# Patient Record
Sex: Female | Born: 1940 | Race: White | Hispanic: No | Smoking: Former smoker
Health system: Southern US, Community
[De-identification: ages and names within clinical notes are randomized; demographics above are authoritative.]

## PROBLEM LIST (undated history)

## (undated) DIAGNOSIS — R011 Cardiac murmur, unspecified: Secondary | ICD-10-CM

## (undated) DIAGNOSIS — Z8719 Personal history of other diseases of the digestive system: Secondary | ICD-10-CM

## (undated) DIAGNOSIS — I10 Essential (primary) hypertension: Secondary | ICD-10-CM

## (undated) DIAGNOSIS — B159 Hepatitis A without hepatic coma: Secondary | ICD-10-CM

## (undated) DIAGNOSIS — K8020 Calculus of gallbladder without cholecystitis without obstruction: Secondary | ICD-10-CM

## (undated) DIAGNOSIS — Z9889 Other specified postprocedural states: Secondary | ICD-10-CM

## (undated) DIAGNOSIS — H9319 Tinnitus, unspecified ear: Secondary | ICD-10-CM

## (undated) DIAGNOSIS — C801 Malignant (primary) neoplasm, unspecified: Secondary | ICD-10-CM

## (undated) DIAGNOSIS — E039 Hypothyroidism, unspecified: Secondary | ICD-10-CM

## (undated) HISTORY — DX: Hypothyroidism, unspecified: E03.9

## (undated) HISTORY — DX: Malignant (primary) neoplasm, unspecified: C80.1

## (undated) HISTORY — DX: Hepatitis a without hepatic coma: B15.9

## (undated) HISTORY — DX: Other specified postprocedural states: Z98.890

## (undated) HISTORY — DX: Personal history of other diseases of the digestive system: Z87.19

## (undated) HISTORY — DX: Calculus of gallbladder without cholecystitis without obstruction: K80.20

## (undated) HISTORY — DX: Essential (primary) hypertension: I10

## (undated) HISTORY — DX: Cardiac murmur, unspecified: R01.1

---
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
## 1960-07-02 DIAGNOSIS — B159 Hepatitis A without hepatic coma: Secondary | ICD-10-CM

## 1960-07-02 HISTORY — DX: Hepatitis a without hepatic coma: B15.9

## 1985-07-02 HISTORY — PX: ABDOMINAL HYSTERECTOMY: SHX81

## 1997-07-02 DIAGNOSIS — R011 Cardiac murmur, unspecified: Secondary | ICD-10-CM

## 1997-07-02 HISTORY — DX: Cardiac murmur, unspecified: R01.1

## 2003-07-03 HISTORY — PX: HERNIA REPAIR: SHX51

## 2003-07-03 HISTORY — PX: COLONOSCOPY: SHX174

## 2003-09-25 DIAGNOSIS — Z8719 Personal history of other diseases of the digestive system: Secondary | ICD-10-CM

## 2003-09-25 HISTORY — DX: Personal history of other diseases of the digestive system: Z87.19

## 2004-11-02 ENCOUNTER — Ambulatory Visit: Payer: Self-pay | Admitting: Family Medicine

## 2004-11-07 ENCOUNTER — Ambulatory Visit: Payer: Self-pay | Admitting: General Surgery

## 2005-08-13 ENCOUNTER — Ambulatory Visit: Payer: Self-pay | Admitting: Otolaryngology

## 2005-08-30 HISTORY — PX: THYROIDECTOMY: SHX17

## 2005-09-11 ENCOUNTER — Ambulatory Visit: Payer: Self-pay | Admitting: Otolaryngology

## 2005-09-11 ENCOUNTER — Other Ambulatory Visit: Payer: Self-pay

## 2005-09-19 ENCOUNTER — Ambulatory Visit: Payer: Self-pay | Admitting: Otolaryngology

## 2006-04-11 ENCOUNTER — Ambulatory Visit: Payer: Self-pay | Admitting: Family Medicine

## 2007-08-12 ENCOUNTER — Ambulatory Visit: Payer: Self-pay | Admitting: Family Medicine

## 2008-08-23 ENCOUNTER — Ambulatory Visit: Payer: Self-pay | Admitting: Family Medicine

## 2008-10-25 ENCOUNTER — Ambulatory Visit: Payer: Self-pay | Admitting: Family Medicine

## 2009-03-09 ENCOUNTER — Ambulatory Visit: Payer: Self-pay | Admitting: Family Medicine

## 2009-03-09 ENCOUNTER — Encounter: Payer: Self-pay | Admitting: Cardiology

## 2009-03-28 ENCOUNTER — Ambulatory Visit: Payer: Self-pay | Admitting: Unknown Physician Specialty

## 2009-04-13 ENCOUNTER — Inpatient Hospital Stay: Payer: Self-pay | Admitting: Unknown Physician Specialty

## 2009-04-13 HISTORY — PX: TOTAL KNEE ARTHROPLASTY: SHX125

## 2009-04-19 ENCOUNTER — Ambulatory Visit: Payer: Self-pay | Admitting: Internal Medicine

## 2009-04-20 ENCOUNTER — Encounter: Payer: Self-pay | Admitting: Internal Medicine

## 2009-05-02 ENCOUNTER — Encounter: Payer: Self-pay | Admitting: Internal Medicine

## 2009-06-14 LAB — HM PAP SMEAR

## 2009-08-24 ENCOUNTER — Ambulatory Visit: Payer: Self-pay | Admitting: Family Medicine

## 2010-04-04 ENCOUNTER — Ambulatory Visit: Payer: Self-pay | Admitting: Unknown Physician Specialty

## 2010-04-04 ENCOUNTER — Encounter: Payer: Self-pay | Admitting: Cardiology

## 2010-04-05 ENCOUNTER — Encounter: Payer: Self-pay | Admitting: Cardiology

## 2010-04-17 ENCOUNTER — Ambulatory Visit: Payer: Self-pay | Admitting: Cardiology

## 2010-04-26 ENCOUNTER — Inpatient Hospital Stay: Payer: Self-pay | Admitting: Unknown Physician Specialty

## 2010-04-26 HISTORY — PX: REPLACEMENT TOTAL KNEE: SUR1224

## 2010-04-30 ENCOUNTER — Other Ambulatory Visit: Payer: Self-pay | Admitting: Family Medicine

## 2010-08-01 NOTE — Letter (Signed)
Summary: [facility]   [facility]   Imported By: Roderic Ovens 05/03/2010 12:01:26  _____________________________________________________________________  External Attachment:    Type:   Image     Comment:   External Document

## 2010-08-01 NOTE — Assessment & Plan Note (Signed)
Summary: NP6/AMD   Visit Type:  Initial Consult Primary Provider:  Silva Bandy Smith,M.D.  CC:  Surgical clearance for right knee surgery.   Was at pre-op for knee surgery and EKG abnormal.  Dr. Erin Sons is the Orthopedic surgeron.Marland Kitchen  History of Present Illness: 70 yo with history of HTN and left TKR last year presents for cardiology evaluation prior to right TKR.  Patient was at her preoperative appointment last week and an abnormal ECG result was obtained, so she was referred for cardiology evaluation.  I reviewed the ECG from last week, it was labeled as prior anterior MI.  It showed poor anterior R wave progression (no definite evidence for MI).  ECG in our office today and preoperatively in 9/10 were almost identical and were normal.  I suspect the ECG last week was somewhat abnormal due to precordial lead misplacement.   Patient has never had exertional chest pain or significant exertional dyspnea.  She is limited by right knee pain but is able to get up a flight of steps, vacuum, and do all housework without any shortness of breath.  Her BP is under good control and per her report, lipids have been good when PCP has checked them.  She is a nonsmoker.    ECG (today): NSR, normal (same as 9/10).   Preventive Screening-Counseling & Management  Alcohol-Tobacco     Smoking Status: quit  Caffeine-Diet-Exercise     Does Patient Exercise: no      Drug Use:  no.    Current Medications (verified): 1)  Synthroid 112 Mcg Tabs (Levothyroxine Sodium) .... One Tablet Every A.m. 2)  Fexofenadine Hcl 180 Mg Tabs (Fexofenadine Hcl) .... One Tablet Once Daily 3)  Omeprazole 40 Mg Cpdr (Omeprazole) .... One Tablet Once Daily 4)  Nasonex 50 Mcg/act Susp (Mometasone Furoate) .... Two Sprays in Both Nostrils Daily As Needed For Allergic Rhinitis 5)  Tums Ultra 1000 1000 Mg Chew (Calcium Carbonate Antacid) .... As Needed For Antacid 6)  Calcium-Vitamin D 500-200 Mg-Unit Tabs (Calcium Carbonate-Vitamin  D) .... One Tablet Three Times A Day 7)  Vicodin 5-500 Mg Tabs (Hydrocodone-Acetaminophen) .Marland Kitchen.. 1-2 Tablets Every Six Hours As Needed For Pain 8)  Lisinopril-Hydrochlorothiazide 10-12.5 Mg Tabs (Lisinopril-Hydrochlorothiazide) .... One Tablet Once Daily 9)  Boniva 150 Mg Tabs (Ibandronate Sodium) .... Once A Month 10)  Antivert 25 Mg Tabs (Meclizine Hcl)  Allergies (verified): 1)  ! Streptomycin Sulfate (Streptomycin Sulfate) 2)  ! * Ultram Er 3)  ! * Nucynta 4)  ! Darvocet  Past History:  Family History: Last updated: 04/17/2010 Family History of Cancer: Mother; deceased Family History of CVA or Stroke: Father; deceased No known CAD.   Social History: Last updated: 04/17/2010 Widowed  Retired--Teacher Tobacco Use - Former. --Smoked 1/2 PPD.  Quit in 1987. Alcohol Use - yes--social drinker Regular Exercise - no Drug Use - no  Risk Factors: Exercise: no (04/17/2010)  Risk Factors: Smoking Status: quit (04/17/2010)  Past Medical History: 1. hemorrhoids 2. Hepatitis A 3. Hypothyroidism s/p thyroidectomy 4. hypertension 5. heart murmur  Past Surgical History: s/p total left knee replacement April 13, 2009 @ [facility]. Appendectomy hysterectomy thyroidectomy hernia repair  Family History: Family History of Cancer: Mother; deceased Family History of CVA or Stroke: Father; deceased No known CAD.   Social History: Reviewed history and no changes required. Widowed  Retired--Teacher Tobacco Use - Former. --Smoked 1/2 PPD.  Quit in 1987. Alcohol Use - yes--social drinker Regular Exercise - no Drug Use - no Smoking Status:  quit Does Patient Exercise:  no Drug Use:  no  Review of Systems       All systems reviewed and negative except as per HPI.   Vital Signs:  Patient profile:   70 year old female Height:      64 inches Weight:      188.50 pounds BMI:     32.47 Pulse rate:   97 / minute BP sitting:   128 / 85  (left arm) Cuff size:   regular  Vitals  Entered By: Bishop Dublin, CMA (April 17, 2010 11:02 AM)  Physical Exam  General:  Well developed, well nourished, in no acute distress. Head:  normocephalic and atraumatic Nose:  no deformity, discharge, inflammation, or lesions Mouth:  Teeth, gums and palate normal. Oral mucosa normal. Neck:  Neck supple, no JVD. No masses, thyromegaly or abnormal cervical nodes. Lungs:  Clear bilaterally to auscultation and percussion. Heart:  Non-displaced PMI, chest non-tender; regular rate and rhythm, S1, S2 without murmurs, rubs or gallops. Carotid upstroke normal, no bruit. Pedals normal pulses. No edema, no varicosities. Abdomen:  Bowel sounds positive; abdomen soft and non-tender without masses, organomegaly, or hernias noted. No hepatosplenomegaly. Extremities:  No clubbing or cyanosis. Neurologic:  Alert and oriented x 3. Skin:  Intact without lesions or rashes. Psych:  Normal affect.   Impression & Recommendations:  Problem # 1:  PREOPERATIVE  EVALUATION Patient's ECG is normal and the same as in 9/10.  I suspect the difference with the ECG last week is related to lead placement.  She has reasonable exercise tolerance (> 4 METS), and no history of CAD or CHF.  I think that she can undergo surgery with no further cardiac testing.  Problem # 2:  MURMUR I actually do not hear any significant murmur on exam, though she carries the diagnosis of 'heart murmur."  At this time, no indication for echocardiogram.

## 2010-08-25 ENCOUNTER — Ambulatory Visit: Payer: Self-pay | Admitting: Family Medicine

## 2010-08-29 ENCOUNTER — Ambulatory Visit: Payer: Self-pay | Admitting: Family Medicine

## 2011-01-08 ENCOUNTER — Encounter: Payer: Self-pay | Admitting: Cardiology

## 2011-03-08 ENCOUNTER — Ambulatory Visit: Payer: Self-pay | Admitting: General Surgery

## 2011-09-06 ENCOUNTER — Ambulatory Visit: Payer: Self-pay | Admitting: General Surgery

## 2012-04-17 ENCOUNTER — Ambulatory Visit: Payer: Self-pay | Admitting: Family Medicine

## 2012-04-17 LAB — HM DEXA SCAN

## 2012-07-29 ENCOUNTER — Encounter: Payer: Self-pay | Admitting: General Surgery

## 2012-07-29 ENCOUNTER — Encounter: Payer: Self-pay | Admitting: *Deleted

## 2012-07-31 ENCOUNTER — Encounter: Payer: Self-pay | Admitting: General Surgery

## 2012-08-01 ENCOUNTER — Encounter: Payer: Self-pay | Admitting: *Deleted

## 2012-09-09 ENCOUNTER — Encounter: Payer: Self-pay | Admitting: *Deleted

## 2012-09-10 ENCOUNTER — Ambulatory Visit: Payer: Self-pay | Admitting: General Surgery

## 2012-09-12 ENCOUNTER — Encounter: Payer: Self-pay | Admitting: *Deleted

## 2012-09-18 ENCOUNTER — Ambulatory Visit: Payer: Self-pay | Admitting: General Surgery

## 2012-10-02 ENCOUNTER — Ambulatory Visit (INDEPENDENT_AMBULATORY_CARE_PROVIDER_SITE_OTHER): Payer: Medicare Other | Admitting: General Surgery

## 2012-10-02 ENCOUNTER — Encounter: Payer: Self-pay | Admitting: General Surgery

## 2012-10-02 VITALS — BP 122/84 | HR 68 | Resp 14 | Ht 66.0 in | Wt 181.0 lb

## 2012-10-02 DIAGNOSIS — E039 Hypothyroidism, unspecified: Secondary | ICD-10-CM | POA: Insufficient documentation

## 2012-10-02 DIAGNOSIS — R928 Other abnormal and inconclusive findings on diagnostic imaging of breast: Secondary | ICD-10-CM

## 2012-10-02 NOTE — Patient Instructions (Addendum)
Patient to return as needed.Dr. Elease Hashimoto will order mammograms yearly.

## 2012-10-02 NOTE — Progress Notes (Signed)
Patient ID: Mallory Sanders, female   DOB: March 18, 1941, 72 y.o.   MRN: [id number]  Chief Complaint  Patient presents with  . Follow-up    mammogram     HPI Mallory Sanders is a 72 y.o. female here today for her follow up mammogram done 09/10/12 cat 1.No new breast problems.   HPI/  Past Medical History  Diagnosis Date  . Hemorrhoids   . Hepatitis A 1962  . Hypothyroidism     s/p thyroidectomy  . Hypertension   . Heart murmur 1999  . Gallstone     Past Surgical History  Procedure Laterality Date  . Total knee arthroplasty  04/13/2009    [facility], left knee  . Appendectomy  1950  . Abdominal hysterectomy  1987  . Thyroidectomy  08/2005  . Hernia repair  2005  . Colonoscopy  2005    Dr. Lemar Livings  . Replacement total knee  04/26/10    right    Family History  Problem Relation Age of Onset  . Cancer Mother   . Stroke Father   . Coronary artery disease Neg Hx     Social History History  Substance Use Topics  . Smoking status: Former Smoker -- 0.50 packs/day    Quit date: 07/02/1985  . Smokeless tobacco: Not on file  . Alcohol Use: Yes     Comment: Social drinker    Allergies  Allergen Reactions  . Propoxyphene-Acetaminophen   . Streptomycin   . Tramadol Hcl   . Ultra Antioxidant Formula (Anti-Oxidant) Nausea Only    Current Outpatient Prescriptions  Medication Sig Dispense Refill  . Calcium Carbonate-Vitamin D (CALCIUM-VITAMIN D) 500-200 MG-UNIT per tablet Take 1 tablet by mouth 3 (three) times daily.        . calcium elemental as carbonate (TUMS ULTRA 1000) 400 MG tablet Chew 1,000 mg by mouth as needed.        . fexofenadine (ALLEGRA) 180 MG tablet Take 180 mg by mouth daily.        . lansoprazole (PREVACID) 15 MG capsule Take 15 mg by mouth daily.      Marland Kitchen levothyroxine (SYNTHROID, LEVOTHROID) 112 MCG tablet Take 112 mcg by mouth every morning.        Marland Kitchen lisinopril-hydrochlorothiazide (PRINZIDE,ZESTORETIC) 10-12.5 MG per tablet Take 1 tablet by mouth daily.        .  meclizine (ANTIVERT) 25 MG tablet Take 25 mg by mouth.        . mometasone (NASONEX) 50 MCG/ACT nasal spray Place 2 sprays into the nose as needed.        Marland Kitchen omeprazole (PRILOSEC) 40 MG capsule Take 40 mg by mouth daily.        Marland Kitchen HYDROcodone-acetaminophen (VICODIN) 5-500 MG per tablet Take 1 tablet by mouth every 6 (six) hours as needed.        . ibandronate (BONIVA) 150 MG tablet Take 150 mg by mouth every 30 (thirty) days. Take in the morning with a full glass of water, on an empty stomach, and do not take anything else by mouth or lie down for the next 30 min.        No current facility-administered medications for this visit.    Review of Systems Review of Systems  Constitutional: Negative.   Respiratory: Negative.   Cardiovascular: Negative.     Blood pressure 122/84, pulse 68, resp. rate 14, height 5\' 6"  (1.676 m), weight 181 lb (82.101 kg).  Physical Exam Physical Exam  Constitutional: She appears well-developed and  well-nourished.  Neck: Normal range of motion. Neck supple.  Cardiovascular: Normal rate, regular rhythm and normal heart sounds.   Pulmonary/Chest: Effort normal and breath sounds normal. Right breast exhibits no inverted nipple, no mass, no nipple discharge, no skin change and no tenderness. Left breast exhibits no inverted nipple, no mass, no nipple discharge, no skin change and no tenderness.    Data Reviewed Bilateral mammograms didn't September 10, 2012 were reviewed. No areas of architectural distortion or focal asymmetry as previously identified on the right CC view.  Assessment    Benign breast exam and mammogram. The previously identified small hypoechoic area on ultrasound secondary to minimal ductal dilatation appears no longer evident on mammographic imaging. A repeat ultrasound was not felt necessary.    Plan    Annual bilateral screening mammograms with her primary care provider.        Mallory Sanders 10/02/2012, 9:31 PM

## 2012-11-11 ENCOUNTER — Encounter: Payer: Self-pay | Admitting: General Surgery

## 2012-12-10 DIAGNOSIS — K429 Umbilical hernia without obstruction or gangrene: Secondary | ICD-10-CM | POA: Insufficient documentation

## 2013-01-14 LAB — LIPID PANEL
CHOLESTEROL: 232 mg/dL — AB (ref 0–200)
HDL: 75 mg/dL — AB (ref 35–70)
LDL Cholesterol: 141 mg/dL
Triglycerides: 82 mg/dL (ref 40–160)

## 2013-01-14 LAB — HEPATIC FUNCTION PANEL
ALT: 8 U/L (ref 7–35)
AST: 18 U/L (ref 13–35)

## 2013-01-14 LAB — CBC AND DIFFERENTIAL
HCT: 42 % (ref 36–46)
HEMOGLOBIN: 14 g/dL (ref 12.0–16.0)
Platelets: 268 10*3/uL (ref 150–399)
WBC: 5.7 10*3/mL

## 2013-01-14 LAB — BASIC METABOLIC PANEL
BUN: 12 mg/dL (ref 4–21)
Creatinine: 1 mg/dL (ref 0.5–1.1)
Glucose: 98 mg/dL
Potassium: 4.8 mmol/L (ref 3.4–5.3)
SODIUM: 138 mmol/L (ref 137–147)

## 2013-02-09 ENCOUNTER — Other Ambulatory Visit: Payer: Self-pay | Admitting: General Surgery

## 2013-02-09 ENCOUNTER — Encounter: Payer: Self-pay | Admitting: General Surgery

## 2013-02-09 ENCOUNTER — Ambulatory Visit (INDEPENDENT_AMBULATORY_CARE_PROVIDER_SITE_OTHER): Payer: Medicare Other | Admitting: General Surgery

## 2013-02-09 VITALS — BP 130/70 | HR 74 | Resp 12 | Ht 66.0 in | Wt 186.0 lb

## 2013-02-09 DIAGNOSIS — K429 Umbilical hernia without obstruction or gangrene: Secondary | ICD-10-CM

## 2013-02-09 NOTE — Progress Notes (Addendum)
Patient ID: Mallory Sanders, female   DOB: 11-29-1940, 72 y.o.   MRN: [id number]  Chief Complaint  Patient presents with  . Other    hernia    HPI [name] is a 72 y.o. female here today for an evaluation of an umbilical hernia . Patient noticed this about two months ago. She state it does not hurt and does not  think it is getting bigger.  The patient underwent repair of an incarcerated ventral hernia in 2005 with preperitoneal placement of a 7 cm Kugel patch. HPI  Past Medical History  Diagnosis Date  . Hemorrhoids   . Hepatitis A 1962  . Hypothyroidism     s/p thyroidectomy  . Hypertension   . Heart murmur 1999  . Gallstone     Past Surgical History  Procedure Laterality Date  . Total knee arthroplasty  04/13/2009    [facility], left knee  . Appendectomy  1950  . Abdominal hysterectomy  1987  . Thyroidectomy  08/2005  . Hernia repair  2005  . Colonoscopy  2005    Dr. Lemar Livings  . Replacement total knee  04/26/10    right    Family History  Problem Relation Age of Onset  . Cancer Mother   . Stroke Father   . Coronary artery disease Neg Hx     Social History History  Substance Use Topics  . Smoking status: Former Smoker -- 0.50 packs/day    Quit date: 07/02/1985  . Smokeless tobacco: Never Used  . Alcohol Use: Yes     Comment: Social drinker    Allergies  Allergen Reactions  . Propoxyphene-Acetaminophen   . Streptomycin   . Tramadol Hcl   . Ultra Antioxidant Formula (Anti-Oxidant) Nausea Only    Current Outpatient Prescriptions  Medication Sig Dispense Refill  . Calcium Carbonate-Vitamin D (CALCIUM-VITAMIN D) 500-200 MG-UNIT per tablet Take 1 tablet by mouth 3 (three) times daily.        . calcium elemental as carbonate (TUMS ULTRA 1000) 400 MG tablet Chew 1,000 mg by mouth as needed.        . fexofenadine (ALLEGRA) 180 MG tablet Take 180 mg by mouth daily.        Marland Kitchen HYDROcodone-acetaminophen (VICODIN) 5-500 MG per tablet Take 1 tablet by mouth every 6  (six) hours as needed.        . ibandronate (BONIVA) 150 MG tablet Take 150 mg by mouth every 30 (thirty) days. Take in the morning with a full glass of water, on an empty stomach, and do not take anything else by mouth or lie down for the next 30 min.       . lansoprazole (PREVACID) 15 MG capsule Take 15 mg by mouth daily.      Marland Kitchen levothyroxine (SYNTHROID, LEVOTHROID) 112 MCG tablet Take 112 mcg by mouth every morning.        Marland Kitchen lisinopril-hydrochlorothiazide (PRINZIDE,ZESTORETIC) 10-12.5 MG per tablet Take 1 tablet by mouth daily.        . meclizine (ANTIVERT) 25 MG tablet Take 25 mg by mouth.        . mometasone (NASONEX) 50 MCG/ACT nasal spray Place 2 sprays into the nose as needed.        Marland Kitchen omeprazole (PRILOSEC) 40 MG capsule Take 40 mg by mouth daily.         No current facility-administered medications for this visit.    Review of Systems Review of Systems  Constitutional: Negative.   Respiratory: Negative.  Cardiovascular: Negative.     Blood pressure 130/70, pulse 74, resp. rate 12, height 5\' 6"  (1.676 m), weight 186 lb (84.369 kg). The patient's weight is up 5 pounds from her 2005 exam.  Physical Exam Physical Exam  Constitutional: She is oriented to person, place, and time. She appears well-developed and well-nourished.  Neck: Neck supple.  Cardiovascular: Normal rate, regular rhythm and normal heart sounds.   Pulmonary/Chest: Breath sounds normal.  Abdominal: Soft. Bowel sounds are normal. There is no tenderness. A hernia (reducible umbilical hernia) is present.  Well healed midline scar below the umbilicus. With the legs elevated off the examining table, no evidence of recurrent lower midline hernia.  A lobule of pre-peritoneal fat was reduced through a 1 cm fascial defect at the umbilical level.  Neurological: She is alert and oriented to person, place, and time.  Skin: Skin is warm and dry.    Data Reviewed Laboratory studies dated 01/14/2013 showed a hemoglobin of  14.0, white blood cell count 5700, platelet count of 268,000, creatinine 1.0 with an estimated GFR of 57, normal electrolytes, negative urinalysis. Assessment    Umbilical hernia.    Plan    Considering her previous presentation with incarcerated ventral hernia, I have encouraged her to consider elective repair. She would like to defer this until after Labor Day, which I think is reasonable as she is presently asymptomatic.       [name] 02/09/2013, 1:29 PM

## 2013-02-09 NOTE — Patient Instructions (Addendum)
Hernia, Surgical Repair A hernia occurs when an internal organ pushes out through a weak spot in the belly (abdominal) wall muscles. Hernias commonly occur in the groin and around the navel. Hernias often can be pushed back into place (reduced). Most hernias tend to get worse over time. Problems occur when abdominal contents get stuck in the opening (incarcerated hernia). The blood supply gets cut off (strangulated hernia). This is an emergency and needs surgery. Otherwise, hernia repair can be an elective procedure. This means you can schedule this at your convenience when an emergency is not present. Because complications can occur, if you decide to repair the hernia, it is best to do it soon. When it becomes an emergency procedure, there is increased risk of complications after surgery. CAUSES   Heavy lifting.  Obesity.  Prolonged coughing.  Straining to move your bowels.  Hernias can also occur through a cut (incision) by a surgeonafter an abdominal operation. HOME CARE INSTRUCTIONS Before the repair:  Bed rest is not required. You may continue your normal activities, but avoid heavy lifting (more than 10 pounds) or straining. Cough gently. If you are a smoker, it is best to stop. Even the best hernia repair can break down with the continual strain of coughing.  Do not wear anything tight over your hernia. Do not try to keep it in with an outside bandage or truss. These can damage abdominal contents if they are trapped in the hernia sac.  Eat a normal diet. Avoid constipation. Straining over long periods of time to have a bowel movement will increase hernia size. It also can breakdown repairs. If you cannot do this with diet alone, laxatives or stool softeners may be used. PRIOR TO SURGERY, SEEK IMMEDIATE MEDICAL CARE IF: You have problems (symptoms) of a trapped (incarcerated) hernia. Symptoms include:  An oral temperature above 102 F (38.9 C) develops, or as your caregiver  suggests.  Increasing abdominal pain.  Feeling sick to your stomach(nausea) and vomiting.  You stop passing gas or stool.  The hernia is stuck outside the abdomen, looks discolored, feels hard, or is tender.  You have any changes in your bowel habits or in the hernia that is unusual for you. LET YOUR CAREGIVERS KNOW ABOUT THE FOLLOWING:  Allergies.  Medications taken including herbs, eye drops, over the counter medications, and creams.  Use of steroids (by mouth or creams).  Family or personal history of problems with anesthetics or Novocaine.  Possibility of pregnancy, if this applies.  Personal history of blood clots (thrombophlebitis).  Family or personal history of bleeding or blood problems.  Previous surgery.  Other health problems. BEFORE THE PROCEDURE You should be present 1 hour prior to your procedure, or as directed by your caregiver.  AFTER THE PROCEDURE After surgery, you will be taken to the recovery area. A nurse will watch and check your progress there. Once you are awake, stable, and taking fluids well, you will be allowed to go home as long as there are no problems. Once home, an ice pack (wrapped in a light towel) applied to your operative site may help with discomfort. It may also keep the swelling down. Do not lift anything heavier than 10 pounds (4.55 kilograms). Take showers not baths. Do not drive while taking narcotics. Follow instructions as suggested by your caregiver.  SEEK IMMEDIATE MEDICAL CARE IF: After surgery:  There is redness, swelling, or increasing pain in the wound.  There is pus coming from the wound.  There is  drainage from a wound lasting longer than 1 day.  An unexplained oral temperature above 102 F (38.9 C) develops.  You notice a foul smell coming from the wound or dressing.  There is a breaking open of a wound (edged not staying together) after the sutures have been removed.  You notice increasing pain in the shoulders  (shoulder strap areas).  You develop dizzy episodes or fainting while standing.  You develop persistent nausea or vomiting.  You develop a rash.  You have difficulty breathing.  You develop any reaction or side effects to medications given. MAKE SURE YOU:   Understand these instructions.  Will watch your condition.  Will get help right away if you are not doing well or get worse. Document Released: 12/12/2000 Document Revised: 09/10/2011 Document Reviewed: 11/04/2007 [facility] Patient Information 2014 [place], [place].  Patient's surgery has been scheduled for 03-16-13 at [facility]. It is okay for patient to continue 81 mg aspirin.

## 2013-03-09 ENCOUNTER — Ambulatory Visit: Payer: Self-pay | Admitting: General Surgery

## 2013-03-16 ENCOUNTER — Ambulatory Visit: Payer: Self-pay | Admitting: General Surgery

## 2013-03-16 DIAGNOSIS — K429 Umbilical hernia without obstruction or gangrene: Secondary | ICD-10-CM

## 2013-03-16 HISTORY — PX: HERNIA REPAIR: SHX51

## 2013-03-18 ENCOUNTER — Encounter: Payer: Self-pay | Admitting: General Surgery

## 2013-03-25 ENCOUNTER — Ambulatory Visit (INDEPENDENT_AMBULATORY_CARE_PROVIDER_SITE_OTHER): Payer: Medicare Other | Admitting: General Surgery

## 2013-03-25 ENCOUNTER — Encounter: Payer: Self-pay | Admitting: General Surgery

## 2013-03-25 VITALS — BP 140/80 | HR 74 | Resp 12 | Ht 66.0 in | Wt 189.0 lb

## 2013-03-25 DIAGNOSIS — K429 Umbilical hernia without obstruction or gangrene: Secondary | ICD-10-CM

## 2013-03-25 NOTE — Patient Instructions (Signed)
Patient to return as needed. 

## 2013-03-25 NOTE — Progress Notes (Signed)
Patient ID: Mallory Sanders, female   DOB: [birth year], 72 y.o.   MRN: [id number]  Chief Complaint  Patient presents with  . Routine Post Op    umbilical hernia    HPI Mallory Sanders is a 72 y.o. female here today for her post op umbilical hernia repair done on 03/16/13. Patient state she is doing well. She has not required any narcotic analgesics and surgery.  HPI  Past Medical History  Diagnosis Date  . Hemorrhoids   . Hepatitis A 1962  . Hypothyroidism     s/p thyroidectomy  . Hypertension   . Heart murmur 1999  . Gallstone     Past Surgical History  Procedure Laterality Date  . Total knee arthroplasty  04/13/2009    [facility], left knee  . Appendectomy  1950  . Abdominal hysterectomy  1987  . Thyroidectomy  08/2005  . Colonoscopy  2005    Dr. Lemar Livings  . 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 Hernia repair  2005  . Hernia repair  03/16/13    umbilical hernia repair    Family History  Problem Relation Age of Onset  . Cancer Mother   . Stroke Father   . Coronary artery disease Neg Hx     Social History History  Substance Use Topics  . Smoking status: Former Smoker -- 0.50 packs/day    Quit date: 07/02/1985  . Smokeless tobacco: Never Used  . Alcohol Use: Yes     Comment: Social drinker    Allergies  Allergen Reactions  . Propoxyphene-Acetaminophen   . Streptomycin   . Tramadol Hcl   . Ultra Antioxidant Formula [Anti-Oxidant] Nausea Only    Current Outpatient Prescriptions  Medication Sig Dispense Refill  . Calcium Carbonate-Vitamin D (CALCIUM-VITAMIN D) 500-200 MG-UNIT per tablet Take 1 tablet by mouth 3 (three) times daily.        . calcium elemental as carbonate (TUMS ULTRA 1000) 400 MG tablet Chew 1,000 mg by mouth as needed.        . fexofenadine (ALLEGRA) 180 MG tablet Take 180 mg by mouth daily.        Marland Kitchen HYDROcodone-acetaminophen (VICODIN) 5-500 MG per tablet Take 1 tablet by mouth every 6 (six) hours as needed.        . ibandronate (BONIVA) 150 MG  tablet Take 150 mg by mouth every 30 (thirty) days. Take in the morning with a full glass of water, on an empty stomach, and do not take anything else by mouth or lie down for the next 30 min.       . lansoprazole (PREVACID) 15 MG capsule Take 15 mg by mouth daily.      Marland Kitchen levothyroxine (SYNTHROID, LEVOTHROID) 112 MCG tablet Take 112 mcg by mouth every morning.        Marland Kitchen lisinopril-hydrochlorothiazide (PRINZIDE,ZESTORETIC) 10-12.5 MG per tablet Take 1 tablet by mouth daily.        . meclizine (ANTIVERT) 25 MG tablet Take 25 mg by mouth.        . mometasone (NASONEX) 50 MCG/ACT nasal spray Place 2 sprays into the nose as needed.        Marland Kitchen omeprazole (PRILOSEC) 40 MG capsule Take 40 mg by mouth daily.         No current facility-administered medications for this visit.    Review of Systems Review of Systems  Constitutional: Negative.   Respiratory: Negative.   Cardiovascular: Negative.     Blood pressure 140/80, pulse  74, resp. rate 12, height 5\' 6"  (1.676 m), weight 189 lb (85.73 kg).  Physical Exam Physical Exam  Constitutional: She is oriented to person, place, and time. She appears well-developed and well-nourished.  Abdominal: Hernia: umbilical hernia site well-healed.  Mild bruising at the surgery site. 5 by 9 bruising on left forearm.  Neurological: She is alert and oriented to person, place, and time.  Skin: Skin is warm and dry. No erythema.    Data Reviewed None  Assessment    Doing well status post umbilical hernia repair.     Plan    Care with her nurse lifting was reviewed. Followup will be on an as-needed basis.        [name] 03/25/2013, 9:20 PM

## 2013-04-02 ENCOUNTER — Encounter: Payer: Self-pay | Admitting: General Surgery

## 2013-05-07 ENCOUNTER — Other Ambulatory Visit: Payer: Self-pay

## 2013-09-17 ENCOUNTER — Ambulatory Visit: Payer: Self-pay | Admitting: Family Medicine

## 2014-05-03 ENCOUNTER — Encounter: Payer: Self-pay | Admitting: General Surgery

## 2014-10-22 NOTE — Op Note (Signed)
PATIENT NAME:  Mallory Sanders, Mallory Sanders MR#:  [id number] DATE OF BIRTH:  Jul 02, 1941  DATE OF PROCEDURE:  03/16/2013  PREOPERATIVE DIAGNOSIS: Symptomatic umbilical hernia.   POSTOPERATIVE DIAGNOSIS: Symptomatic umbilical hernia.   OPERATIVE PROCEDURE: Primary umbilical hernia repair.   SURGEON: Hervey Ard, MD.  ANESTHESIA: General by LMA under Dr. Benjamine Mola. Marcaine 0.5% plain, 30 mL local infiltration.   ESTIMATED BLOOD LOSS: Minimal.  CLINICAL NOTE: This 74 year old woman has developed a symptomatic umbilical hernia. She is admitted for elective repair. She received Kefzol prior to the procedure in the event prosthetic mesh was utilized.   The patient underwent general anesthesia without difficulty. ChloraPrep was applied to the skin. An infraumbilical incision was made after instillation of local anesthetic. Skin was incised sharply and the remaining dissection was completed with electrocautery. The hernia sac was dissected from the overlying skin, excised at the fascial level, and discarded. The defect was approximately 1.7 cm in diameter. Palpation through the defect showed no other additional defects.   The defect was closed with interrupted 0 Surgilon sutures placed under direct vision. The skin of the umbilicus was tacked to the fascia with 3-0 Vicryl suture. The deep layer was closed with a running 3-0 Vicryl and the skin closed with a running 4-0 Vicryl subcuticular suture. Benzoin, Steri-Strips, Telfa, and Tegaderm dressing were then applied.   The patient tolerated the procedure well and was taken to the recovery room in stable condition.    ____________________________ Robert Bellow, MD jwb:np D: 03/16/2013 21:32:02 ET T: 03/16/2013 22:58:10 ET JOB#: [id number]  cc: Robert Bellow, MD, <Dictator> Renette Butters. Tamala Julian, MD [place] Amedeo Kinsman MD ELECTRONICALLY SIGNED 03/18/2013 9:16

## 2014-10-27 ENCOUNTER — Ambulatory Visit: Admit: 2014-10-27 | Disposition: A | Discharge status: Home / Self Care | Payer: Self-pay | Admitting: Family Medicine

## 2014-10-27 LAB — HM MAMMOGRAPHY

## 2014-11-08 DIAGNOSIS — M8580 Other specified disorders of bone density and structure, unspecified site: Secondary | ICD-10-CM | POA: Insufficient documentation

## 2014-11-08 DIAGNOSIS — K219 Gastro-esophageal reflux disease without esophagitis: Secondary | ICD-10-CM | POA: Insufficient documentation

## 2014-11-08 DIAGNOSIS — E041 Nontoxic single thyroid nodule: Secondary | ICD-10-CM | POA: Insufficient documentation

## 2014-11-08 DIAGNOSIS — E669 Obesity, unspecified: Secondary | ICD-10-CM | POA: Insufficient documentation

## 2014-11-08 DIAGNOSIS — R011 Cardiac murmur, unspecified: Secondary | ICD-10-CM | POA: Insufficient documentation

## 2014-11-08 DIAGNOSIS — M1990 Unspecified osteoarthritis, unspecified site: Secondary | ICD-10-CM | POA: Insufficient documentation

## 2014-11-08 DIAGNOSIS — I10 Essential (primary) hypertension: Secondary | ICD-10-CM | POA: Insufficient documentation

## 2014-11-08 DIAGNOSIS — J309 Allergic rhinitis, unspecified: Secondary | ICD-10-CM | POA: Insufficient documentation

## 2014-11-08 DIAGNOSIS — R928 Other abnormal and inconclusive findings on diagnostic imaging of breast: Secondary | ICD-10-CM | POA: Insufficient documentation

## 2014-11-08 DIAGNOSIS — Z803 Family history of malignant neoplasm of breast: Secondary | ICD-10-CM | POA: Insufficient documentation

## 2014-12-31 HISTORY — PX: SKIN CANCER EXCISION: SHX779

## 2015-01-04 ENCOUNTER — Encounter: Payer: Self-pay | Admitting: *Deleted

## 2015-01-10 ENCOUNTER — Ambulatory Visit (INDEPENDENT_AMBULATORY_CARE_PROVIDER_SITE_OTHER): Payer: Medicare Other | Admitting: General Surgery

## 2015-01-10 ENCOUNTER — Encounter: Payer: Self-pay | Admitting: General Surgery

## 2015-01-10 VITALS — BP 126/72 | HR 80 | Resp 12 | Ht 64.0 in | Wt 187.0 lb

## 2015-01-10 DIAGNOSIS — Z1211 Encounter for screening for malignant neoplasm of colon: Secondary | ICD-10-CM

## 2015-01-10 MED ORDER — AMOXICILLIN 500 MG PO TABS: 500.0000 mg | ORAL_TABLET | Freq: Once | ORAL | Status: DC

## 2015-01-10 MED ORDER — POLYETHYLENE GLYCOL 3350 17 GM/SCOOP PO POWD: ORAL | Status: DC

## 2015-01-10 NOTE — Progress Notes (Signed)
Patient ID: Mallory Sanders, female   DOB: 1940/12/29, 74 y.o.   MRN: [id number]  Chief Complaint  Patient presents with  . Other    colonoscopy    HPI Mallory Sanders is a 74 y.o. female here today for a evaluation of a colonoscopy. Last colonoscopy was done on 11/03/2004. Patient states she is not having any GI problems at this time.  HPI  Past Medical History  Diagnosis Date  . Hemorrhoids   . Hepatitis A 1962  . Hypothyroidism     s/p thyroidectomy  . Hypertension   . Heart murmur 1999  . Gallstone   . 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 Total knee arthroplasty  04/13/2009    [facility], left knee  . Appendectomy  1950  . Abdominal hysterectomy  1987  . Thyroidectomy  08/2005  . Colonoscopy  2005    Dr. Bary Castilla  . 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 Hernia repair  2005  . Hernia repair  9/56/38    umbilical hernia repair    Family History  Problem Relation Age of Onset  . Cancer Mother   . Breast cancer Mother 70  . Stroke Father   . Coronary artery disease Neg Hx   . Breast cancer Maternal Aunt   . Colon cancer Maternal Uncle   . Breast cancer Other     Social History History  Substance Use Topics  . Smoking status: Former Smoker -- 0.50 packs/day    Quit date: 07/02/1985  . Smokeless tobacco: Never Used  . Alcohol Use: Yes     Comment: Social drinker    Allergies  Allergen Reactions  . Propoxyphene N-Acetaminophen   . Streptomycin   . Tramadol Hcl   . Ultra Antioxidant Formula [Anti-Oxidant] Nausea Only    Current Outpatient Prescriptions  Medication Sig Dispense Refill  . aspirin 81 MG tablet Take by mouth.    . Calcium Carbonate-Vitamin D (CALCIUM-VITAMIN D) 500-200 MG-UNIT per tablet Take 1 tablet by mouth 3 (three) times daily.      . calcium elemental as carbonate (TUMS ULTRA 1000) 400 MG tablet Chew 1,000 mg by mouth as needed.      . fexofenadine (ALLEGRA) 180 MG tablet  Take 180 mg by mouth daily.      Marland Kitchen levothyroxine (SYNTHROID, LEVOTHROID) 112 MCG tablet Take 112 mcg by mouth every morning.      Marland Kitchen lisinopril-hydrochlorothiazide (PRINZIDE,ZESTORETIC) 10-12.5 MG per tablet Take 1 tablet by mouth daily.      . mometasone (NASONEX) 50 MCG/ACT nasal spray Place 2 sprays into the nose as needed.      Marland Kitchen amoxicillin (AMOXIL) 500 MG tablet Take 1 tablet (500 mg total) by mouth once. 4 tablet 0  . polyethylene glycol powder (GLYCOLAX/MIRALAX) powder 255 grams one bottle for colonoscopy prep 255 g 0   No current facility-administered medications for this visit.    Review of Systems Review of Systems  Blood pressure 126/72, pulse 80, resp. rate 12, height 5\' 4"  (1.626 m), weight 187 lb (84.823 kg).  Physical Exam Physical Exam  Constitutional: She is oriented to person, place, and time. She appears well-nourished.  HENT:  Head:    Mouth/Throat: Oropharynx is clear and moist.  Eyes: Conjunctivae are normal. No scleral icterus.  Neck: Neck supple.  Cardiovascular: Normal rate, regular rhythm and normal heart sounds.   Pulses:  Dorsalis pedis pulses are 2+ on the right side, and 2+ on the left side.       Posterior tibial pulses are 2+ on the right side, and 2+ on the left side.  No edema  Pulmonary/Chest: Effort normal and breath sounds normal.  Abdominal: Soft. Normal appearance and bowel sounds are normal. There is no tenderness. No hernia.  Lymphadenopathy:    She has no cervical adenopathy.  Neurological: She is alert and oriented to person, place, and time.  Skin: Skin is warm and dry.    Data Reviewed Colonoscopy of 11/07/2004 was reviewed. Normal study.  Assessment    Candidate for screening colonoscopy. Antibiotic prophylaxis based on previous bilateral total knee replacement and orthopedic request.    Plan    The patient tolerates amoxicillin well. She'll make use of 500 mg, 4 tablets by mouth 1 hour prior to the procedure.     Colonoscopy with possible biopsy/polypectomy prn: Information regarding the procedure, including its potential risks and complications (including but not limited to perforation of the bowel, which may require emergency surgery to repair, and bleeding) was verbally given to the patient. Educational information regarding lower instestinal endoscopy was given to the patient. Written instructions for how to complete the bowel prep using Miralax were provided. The importance of drinking ample fluids to avoid dehydration as a result of the prep emphasized.  Patient has been scheduled for a colonoscopy on 02-01-15 at [facility]. It is okay for patient to continue 81 mg aspirin once daily.   Robert Bellow 01/10/2015, 12:40 PM

## 2015-01-10 NOTE — Patient Instructions (Addendum)
Colonoscopy A colonoscopy is an exam to look at the entire large intestine (colon). This exam can help find problems such as tumors, polyps, inflammation, and areas of bleeding. The exam takes about 1 hour.  LET [facility] CARE PROVIDER KNOW ABOUT:   Any allergies you have.  All medicines you are taking, including vitamins, herbs, eye drops, creams, and over-the-counter medicines.  Previous problems you or members of your family have had with the use of anesthetics.  Any blood disorders you have.  Previous surgeries you have had.  Medical conditions you have. RISKS AND COMPLICATIONS  Generally, this is a safe procedure. However, as with any procedure, complications can occur. Possible complications include:  Bleeding.  Tearing or rupture of the colon wall.  Reaction to medicines given during the exam.  Infection (rare). BEFORE THE PROCEDURE   Ask your health care provider about changing or stopping your regular medicines.  You may be prescribed an oral bowel prep. This involves drinking a large amount of medicated liquid, starting the day before your procedure. The liquid will cause you to have multiple loose stools until your stool is almost clear or light green. This cleans out your colon in preparation for the procedure.  Do not eat or drink anything else once you have started the bowel prep, unless your health care provider tells you it is safe to do so.  Arrange for someone to drive you home after the procedure. PROCEDURE   You will be given medicine to help you relax (sedative).  You will lie on your side with your knees bent.  A long, flexible tube with a light and camera on the end (colonoscope) will be inserted through the rectum and into the colon. The camera sends video back to a computer screen as it moves through the colon. The colonoscope also releases carbon dioxide gas to inflate the colon. This helps your health care provider see the area better.  During  the exam, your health care provider may take a small tissue sample (biopsy) to be examined under a microscope if any abnormalities are found.  The exam is finished when the entire colon has been viewed. AFTER THE PROCEDURE   Do not drive for 24 hours after the exam.  You may have a small amount of blood in your stool.  You may pass moderate amounts of gas and have mild abdominal cramping or bloating. This is caused by the gas used to inflate your colon during the exam.  Ask when your test results will be ready and how you will get your results. Make sure you get your test results. Document Released: 06/15/2000 Document Revised: 04/08/2013 Document Reviewed: 02/23/2013 [facility] Patient Information 2015 [place], [place]. This information is not intended to replace advice given to you by your health care provider. Make sure you discuss any questions you have with your health care provider.  Patient has been scheduled for a colonoscopy on 02-01-15 at [facility]. It is okay for patient to continue 81 mg aspirin once daily.

## 2015-01-10 NOTE — H&P (Signed)
Patient ID: Mallory Sanders, female   DOB: [birth year], 74 y.o.   MRN: [id number]    Chief Complaint   Patient presents with   .  Other       colonoscopy      HPI Mallory Sanders is a 74 y.o. female here today for a evaluation of a colonoscopy. Last colonoscopy was done on 11/03/2004. Patient states she is not having any GI problems at this time.  HPI    Past Medical History   Diagnosis  Date   .  Hemorrhoids     .  Hepatitis A  1962   .  Hypothyroidism         s/p thyroidectomy   .  Hypertension     .  Heart murmur  1999   .  Gallstone     .  Cancer         skin cancer on left side of face       Past Surgical History   Procedure  Laterality  Date   .  Total knee arthroplasty    04/13/2009       [facility], left knee   .  Appendectomy    1950   .  Abdominal hysterectomy    1987   .  Thyroidectomy    08/2005   .  Colonoscopy    2005       Dr. Bary Castilla   .  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  Hernia repair    2005   .  Hernia repair    11/16/98       umbilical hernia repair       Family History   Problem  Relation  Age of Onset   .  Cancer  Mother     .  Breast cancer  Mother  75   .  Stroke  Father     .  Coronary artery disease  Neg Hx     .  Breast cancer  Maternal Aunt     .  Colon cancer  Maternal Uncle     .  Breast cancer  Other        Social History History   Substance Use Topics   .  Smoking status:  Former Smoker -- 0.50 packs/day       Quit date:  07/02/1985   .  Smokeless tobacco:  Never Used   .  Alcohol Use:  Yes         Comment: Social drinker       Allergies   Allergen  Reactions   .  Propoxyphene N-Acetaminophen     .  Streptomycin     .  Tramadol Hcl     .  Ultra Antioxidant Formula [Anti-Oxidant]  Nausea Only       Current Outpatient Prescriptions   Medication  Sig  Dispense  Refill   .  aspirin 81 MG tablet  Take by mouth.       .  Calcium Carbonate-Vitamin D (CALCIUM-VITAMIN D) 500-200 MG-UNIT per tablet  Take 1 tablet by  mouth 3 (three) times daily.         .  calcium elemental as carbonate (TUMS ULTRA 1000) 400 MG tablet  Chew 1,000 mg by mouth as needed.         .  fexofenadine (ALLEGRA) 180 MG tablet  Take 180 mg by mouth daily.        Marland Kitchen  levothyroxine (SYNTHROID, LEVOTHROID) 112 MCG tablet  Take 112 mcg by mouth every morning.         Marland Kitchen  lisinopril-hydrochlorothiazide (PRINZIDE,ZESTORETIC) 10-12.5 MG per tablet  Take 1 tablet by mouth daily.        .  mometasone (NASONEX) 50 MCG/ACT nasal spray  Place 2 sprays into the nose as needed.         Marland Kitchen  amoxicillin (AMOXIL) 500 MG tablet  Take 1 tablet (500 mg total) by mouth once.  4 tablet  0   .  polyethylene glycol powder (GLYCOLAX/MIRALAX) powder  255 grams one bottle for colonoscopy prep  255 g  0       No current facility-administered medications for this visit.      Review of Systems Review of Systems   Blood pressure 126/72, pulse 80, resp. rate 12, height 5\' 4"  (1.626 m), weight 187 lb (84.823 kg).   Physical Exam Physical Exam  Constitutional: She is oriented to person, place, and time. She appears well-nourished.  HENT:   Head:   graphic Mouth/Throat: Oropharynx is clear and moist.  Eyes: Conjunctivae are normal. No scleral icterus.  Neck: Neck supple.  Cardiovascular: Normal rate, regular rhythm and normal heart sounds.    Pulses:      Dorsalis pedis pulses are 2+ on the right side, and 2+ on the left side.        Posterior tibial pulses are 2+ on the right side, and 2+ on the left side.  No edema  Pulmonary/Chest: Effort normal and breath sounds normal.  Abdominal: Soft. Normal appearance and bowel sounds are normal. There is no tenderness. No hernia.  Lymphadenopathy:    She has no cervical adenopathy.  Neurological: She is alert and oriented to person, place, and time.  Skin: Skin is warm and dry.    Data Reviewed Colonoscopy of 11/07/2004 was reviewed. Normal study.   Assessment Candidate for screening colonoscopy.  Antibiotic prophylaxis based on previous bilateral total knee replacement and orthopedic request.   Plan The patient tolerates amoxicillin well. She'll make use of 500 mg, 4 tablets by mouth 1 hour prior to the procedure.   Colonoscopy with possible biopsy/polypectomy prn: Information regarding the procedure, including its potential risks and complications (including but not limited to perforation of the bowel, which may require emergency surgery to repair, and bleeding) was verbally given to the patient. Educational information regarding lower instestinal endoscopy was given to the patient. Written instructions for how to complete the bowel prep using Miralax were provided. The importance of drinking ample fluids to avoid dehydration as a result of the prep emphasized.   Patient has been scheduled for a colonoscopy on 02-01-15 at [facility]. It is okay for patient to continue 81 mg aspirin once daily.    Robert Bellow 01/10/2015, 12:40 PM

## 2015-01-24 ENCOUNTER — Encounter: Payer: Self-pay | Admitting: Family Medicine

## 2015-02-01 ENCOUNTER — Encounter: Payer: Self-pay | Admitting: Anesthesiology

## 2015-02-01 ENCOUNTER — Encounter
Admission: RE | Disposition: A | Discharge status: Home / Self Care | Payer: Self-pay | Source: Ambulatory Visit | Attending: General Surgery

## 2015-02-01 ENCOUNTER — Ambulatory Visit: Payer: Medicare Other | Admitting: Anesthesiology

## 2015-02-01 ENCOUNTER — Ambulatory Visit
Admission: RE | Admit: 2015-02-01 | Discharge: 2015-02-01 | Disposition: A | Discharge status: Home / Self Care | Payer: Medicare Other | Source: Ambulatory Visit | Attending: General Surgery | Admitting: General Surgery

## 2015-02-01 DIAGNOSIS — Z803 Family history of malignant neoplasm of breast: Secondary | ICD-10-CM | POA: Insufficient documentation

## 2015-02-01 DIAGNOSIS — Z85828 Personal history of other malignant neoplasm of skin: Secondary | ICD-10-CM | POA: Diagnosis not present

## 2015-02-01 DIAGNOSIS — Z1211 Encounter for screening for malignant neoplasm of colon: Secondary | ICD-10-CM | POA: Diagnosis not present

## 2015-02-01 DIAGNOSIS — Z800 Family history of malignant neoplasm of digestive organs: Secondary | ICD-10-CM | POA: Diagnosis not present

## 2015-02-01 DIAGNOSIS — Z8249 Family history of ischemic heart disease and other diseases of the circulatory system: Secondary | ICD-10-CM | POA: Insufficient documentation

## 2015-02-01 DIAGNOSIS — Z9889 Other specified postprocedural states: Secondary | ICD-10-CM | POA: Insufficient documentation

## 2015-02-01 DIAGNOSIS — I10 Essential (primary) hypertension: Secondary | ICD-10-CM | POA: Diagnosis not present

## 2015-02-01 DIAGNOSIS — Z96651 Presence of right artificial knee joint: Secondary | ICD-10-CM | POA: Diagnosis not present

## 2015-02-01 DIAGNOSIS — R011 Cardiac murmur, unspecified: Secondary | ICD-10-CM | POA: Insufficient documentation

## 2015-02-01 DIAGNOSIS — E039 Hypothyroidism, unspecified: Secondary | ICD-10-CM | POA: Insufficient documentation

## 2015-02-01 HISTORY — PX: COLONOSCOPY WITH PROPOFOL: SHX5780

## 2015-02-01 SURGERY — COLONOSCOPY WITH PROPOFOL
Anesthesia: General

## 2015-02-01 MED ORDER — SODIUM CHLORIDE 0.9 % IV SOLN
INTRAVENOUS | Status: DC
  Administered 2015-02-01: 1000 mL via INTRAVENOUS

## 2015-02-01 MED ORDER — MIDAZOLAM HCL 2 MG/2ML IJ SOLN
INTRAMUSCULAR | Status: DC | PRN
  Administered 2015-02-01: 1 mg via INTRAVENOUS

## 2015-02-01 MED ORDER — PROPOFOL INFUSION 10 MG/ML OPTIME
INTRAVENOUS | Status: DC | PRN
  Administered 2015-02-01: 140 ug/kg/min via INTRAVENOUS

## 2015-02-01 MED ORDER — LIDOCAINE HCL (CARDIAC) 20 MG/ML IV SOLN
INTRAVENOUS | Status: DC | PRN
  Administered 2015-02-01: 60 mg via INTRAVENOUS

## 2015-02-01 NOTE — Op Note (Signed)
[facility] Patient Name: Mallory Sanders Procedure Date: 02/01/2015 12:28 PM MRN: [id number] Account #: [id number] Date of Birth: April 15, 1941 Admit Type: Outpatient Age: 74 Room: [facility] ENDO ROOM 1 Gender: Female Note Status: Finalized Procedure:         Colonoscopy Indications:       Screening for colorectal malignant neoplasm Providers:         Robert Bellow, MD Referring MD:      Jerrell Belfast, MD (Referring MD) Medicines:         Monitored Anesthesia Care Complications:     No immediate complications. Procedure:         Pre-Anesthesia Assessment:                    - Prior to the procedure, a History and Physical was                     performed, and patient medications, allergies and                     sensitivities were reviewed. The patient's tolerance of                     previous anesthesia was reviewed.                    - The risks and benefits of the procedure and the sedation                     options and risks were discussed with the patient. All                     questions were answered and informed consent was obtained.                    After obtaining informed consent, the colonoscope was                     passed under direct vision. Throughout the procedure, the                     patient's blood pressure, pulse, and oxygen saturations                     were monitored continuously. The Colonoscope was                     introduced through the anus and advanced to the the cecum,                     identified by appendiceal orifice and ileocecal valve. The                     colonoscopy was performed without difficulty. The patient                     tolerated the procedure well. The quality of the bowel                     preparation was excellent. Findings:      The entire examined colon appeared normal on direct and retroflexion       views. Impression:        - The entire examined colon is normal on  direct and  retroflexion views.                    - No specimens collected. Recommendation:    - Repeat colonoscopy in 10 years for screening purposes. Diagnosis Code(s): --- Professional ---                    Z12.11, Encounter for screening for malignant neoplasm of                     colon Robert Bellow, MD 02/01/2015 1:53:46 PM This report has been signed electronically. Number of Addenda: 0 Note Initiated On: 02/01/2015 12:28 PM Scope Withdrawal Time: 0 hours 10 minutes 42 seconds  Total Procedure Duration: 0 hours 18 minutes 33 seconds       [facility]

## 2015-02-01 NOTE — H&P (Signed)
No change in clinical history or exam.  For colonoscopy today.

## 2015-02-01 NOTE — Transfer of Care (Signed)
Immediate Anesthesia Transfer of Care Note  [name]  Procedure(s) Performed: Procedure(s): COLONOSCOPY WITH PROPOFOL (N/A)  Patient Location: PACU, ICU and Endoscopy Unit  Anesthesia Type:General  Level of Consciousness: sedated  Airway & Oxygen Therapy: Patient Spontanous Breathing and Patient connected to nasal cannula oxygen  Post-op Assessment: Report given to RN and Post -op Vital signs reviewed and stable  Post vital signs: Reviewed and stable  Last Vitals:  Filed Vitals:   02/01/15 1356  BP: 106/59  Pulse: 77  Temp: 37.9 C  Resp:     Complications: No apparent anesthesia complications

## 2015-02-01 NOTE — Anesthesia Postprocedure Evaluation (Signed)
  Anesthesia Post-op Note  [name]  Procedure(s) Performed: Procedure(s): COLONOSCOPY WITH PROPOFOL (N/A)  Anesthesia type:General  Patient location: PACU  Post pain: Pain level controlled  Post assessment: Post-op Vital signs reviewed, Patient's Cardiovascular Status Stable, Respiratory Function Stable, Patent Airway and No signs of Nausea or vomiting  Post vital signs: Reviewed and stable  Last Vitals:  Filed Vitals:   02/01/15 1430  BP: 123/70  Pulse: 70  Temp:   Resp: 22    Level of consciousness: awake, alert  and patient cooperative  Complications: No apparent anesthesia complications

## 2015-02-01 NOTE — Anesthesia Preprocedure Evaluation (Signed)
Anesthesia Evaluation  Patient identified by MRN, date of birth, ID band Patient awake    Reviewed: Allergy & Precautions, H&P , NPO status , Patient's Chart, lab work & pertinent test results, reviewed documented beta blocker date and time   History of Anesthesia Complications Negative for: history of anesthetic complications  Airway Mallampati: I  TM Distance: >3 FB Neck ROM: full    Dental no notable dental hx.    Pulmonary neg pulmonary ROS, former smoker,  breath sounds clear to auscultation  Pulmonary exam normal       Cardiovascular Exercise Tolerance: Good hypertension, - angina- CAD, - Past MI and - CABG Normal cardiovascular exam- dysrhythmias + Valvular Problems/Murmurs Rhythm:regular Rate:Normal     Neuro/Psych negative neurological ROS  negative psych ROS   GI/Hepatic Neg liver ROS, GERD-  Medicated and Controlled,(+) Hepatitis - (in 1962), A  Endo/Other  neg diabetesHypothyroidism   Renal/GU negative Renal ROS  negative genitourinary   Musculoskeletal   Abdominal   Peds  Hematology negative hematology ROS (+)   Anesthesia Other Findings Past Medical History:   Hemorrhoids                                                  Hepatitis A                                     1962         Hypothyroidism                                                 Comment:s/p thyroidectomy   Hypertension                                                 Heart murmur                                    1999         Gallstone                                                    Cancer                                                         Comment:skin cancer on left side of face   S/P repair of ventral hernia                    09/25/2003     Comment:Lower abdominal incarcerated ventral hernia               repaired  with Kugel patch   Reproductive/Obstetrics negative OB ROS                              Anesthesia Physical Anesthesia Plan  ASA: II  Anesthesia Plan: General   Post-op Pain Management:    Induction:   Airway Management Planned:   Additional Equipment:   Intra-op Plan:   Post-operative Plan:   Informed Consent: I have reviewed the patients History and Physical, chart, labs and discussed the procedure including the risks, benefits and alternatives for the proposed anesthesia with the patient or authorized representative who has indicated his/her understanding and acceptance.   Dental Advisory Given  Plan Discussed with: Anesthesiologist, CRNA and Surgeon  Anesthesia Plan Comments:         Anesthesia Quick Evaluation

## 2015-02-03 ENCOUNTER — Encounter: Payer: Self-pay | Admitting: General Surgery

## 2015-03-09 ENCOUNTER — Encounter: Payer: Self-pay | Admitting: General Surgery

## 2015-04-08 ENCOUNTER — Ambulatory Visit (INDEPENDENT_AMBULATORY_CARE_PROVIDER_SITE_OTHER): Payer: Medicare Other | Admitting: Family Medicine

## 2015-04-08 ENCOUNTER — Encounter: Payer: Self-pay | Admitting: Family Medicine

## 2015-04-08 VITALS — BP 118/68 | HR 72 | Temp 97.7°F | Resp 16 | Ht 64.5 in | Wt 188.0 lb

## 2015-04-08 DIAGNOSIS — I10 Essential (primary) hypertension: Secondary | ICD-10-CM | POA: Diagnosis not present

## 2015-04-08 DIAGNOSIS — E7800 Pure hypercholesterolemia, unspecified: Secondary | ICD-10-CM

## 2015-04-08 DIAGNOSIS — Z0000 Encounter for general adult medical examination without abnormal findings: Secondary | ICD-10-CM

## 2015-04-08 DIAGNOSIS — M8580 Other specified disorders of bone density and structure, unspecified site: Secondary | ICD-10-CM

## 2015-04-08 DIAGNOSIS — K219 Gastro-esophageal reflux disease without esophagitis: Secondary | ICD-10-CM | POA: Diagnosis not present

## 2015-04-08 DIAGNOSIS — Z23 Encounter for immunization: Secondary | ICD-10-CM | POA: Diagnosis not present

## 2015-04-08 MED ORDER — LANSOPRAZOLE 30 MG PO CPDR: DELAYED_RELEASE_CAPSULE | ORAL | Status: DC

## 2015-04-08 MED ORDER — LISINOPRIL-HYDROCHLOROTHIAZIDE 10-12.5 MG PO TABS: 1.0000 | ORAL_TABLET | Freq: Every day | ORAL | Status: DC

## 2015-04-08 NOTE — Progress Notes (Signed)
Patient ID: Mallory Sanders, female   DOB: 1940-10-11, 74 y.o.   MRN: [id number]         [name], Female    DOB: 08-Sep-1940, 74 y.o.   MRN: [id number] Visit Date: 04/08/2015  Today's Provider: Margarita Rana, MD   Chief Complaint  Patient presents with  . Medicare Wellness   Subjective:    Annual wellness visit Mallory Sanders is a 74 y.o. female. She feels well. She reports exercising regularly.  [facility], she will start on Thursday.   She reports she is sleeping well.  -----------------------------------------------------------   Review of Systems  Constitutional: Negative.   HENT: Negative.   Eyes: Negative.   Respiratory: Negative.   Cardiovascular: Negative.   Gastrointestinal: Negative.   Endocrine: Negative.   Genitourinary: Negative.   Musculoskeletal: Negative.   Skin: Negative.   Allergic/Immunologic: Negative.   Neurological: Negative.   Hematological: Negative.   Psychiatric/Behavioral: Negative.     Social History   Social History  . Marital Status: Widowed    Spouse Name: N/A  . Number of Children: 1  . Years of Education: Master's   Occupational History  . Retired, Pharmacist, hospital    Social History Main Topics  . Smoking status: Former Smoker -- 0.50 packs/day    Quit date: 07/02/1985  . Smokeless tobacco: Never Used  . Alcohol Use: Yes     Comment: Social drinker  . Drug Use: No  . Sexual Activity: Not on file   Other Topics Concern  . Not on file   Social History Narrative   Widowed   Does not get regular exercise    Patient Active Problem List   Diagnosis Date Noted  . Encounter for screening colonoscopy 01/10/2015  . Abnormal finding on mammography 11/08/2014  . Allergic rhinitis 11/08/2014  . Family history of breast cancer 11/08/2014  . Acid reflux 11/08/2014  . Cardiac murmur 11/08/2014  . Benign hypertension 11/08/2014  . Adiposity 11/08/2014  . Arthritis, degenerative  11/08/2014  . Osteopenia 11/08/2014  . Thyroid nodule 11/08/2014  . Umbilical hernia 86/76/7209  . Hypothyroidism 10/02/2012    Past Surgical History  Procedure Laterality Date  . Total knee arthroplasty  04/13/2009    [facility], left knee  . Appendectomy  1950  . Abdominal hysterectomy  1987  . Thyroidectomy  08/2005  . Colonoscopy  2005    Dr. Bary Castilla  . 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 Hernia repair  2005    Lower abdominal incarcerated ventral hernia repaired with Kugel patch  . Hernia repair  4/70/96    umbilical hernia repair  . Colonoscopy with propofol N/A 02/01/2015    Procedure: COLONOSCOPY WITH PROPOFOL;  Surgeon: Robert Bellow, MD;  Location: [facility] ENDOSCOPY;  Service: Endoscopy;  Laterality: N/A;  . Skin cancer excision  12/2014    Her family history includes Breast cancer in her maternal aunt and other; Breast cancer (age of onset: 2) in her mother; Cancer in her mother; Colon cancer in her maternal uncle; Leukemia in her mother; Stroke in her father. There is no history of Coronary artery disease.    Previous Medications   ASPIRIN 81 MG TABLET    Take by mouth.   CALCIUM CARBONATE-VITAMIN D (CALCIUM-VITAMIN D) 500-200 MG-UNIT PER TABLET    Take 1 tablet by mouth 3 (three) times daily.     FEXOFENADINE (ALLEGRA) 180 MG TABLET    Take 180  mg by mouth daily.     LANSOPRAZOLE (PREVACID) 30 MG CAPSULE    Take 30 mg by mouth daily at 12 noon.   LEVOTHYROXINE (SYNTHROID, LEVOTHROID) 100 MCG TABLET       LEVOTHYROXINE (SYNTHROID, LEVOTHROID) 112 MCG TABLET    Take 112 mcg by mouth every morning.     LISINOPRIL-HYDROCHLOROTHIAZIDE (PRINZIDE,ZESTORETIC) 10-12.5 MG PER TABLET    Take 1 tablet by mouth daily.     MECLIZINE (ANTIVERT) 25 MG TABLET    Take 25 mg by mouth 3 (three) times daily as needed for dizziness.   TRIAMCINOLONE (NASACORT ALLERGY 24HR) 55 MCG/ACT AERO NASAL INHALER    Place 2 sprays into the nose daily.    Patient Care Team: Margarita Rana, MD  as PCP - General (Family Medicine) Robert Bellow, MD as Consulting Physician (General Surgery) Margarita Rana, MD as Referring Physician (Family Medicine)     Objective:   Vitals: BP 118/68 mmHg  Pulse 72  Temp(Src) 97.7 F (36.5 C) (Oral)  Resp 16  Ht 5' 4.5" (1.638 m)  Wt 188 lb (85.276 kg)  BMI 31.78 kg/m2  Physical Exam  Constitutional: She is oriented to person, place, and time. She appears well-developed and well-nourished.  HENT:  Head: Normocephalic and atraumatic.  Right Ear: Tympanic membrane, external ear and ear canal normal.  Left Ear: Tympanic membrane, external ear and ear canal normal.  Nose: Nose normal.  Mouth/Throat: Uvula is midline, oropharynx is clear and moist and mucous membranes are normal.  Eyes: Conjunctivae, EOM and lids are normal. Lids are everted and swept, no foreign bodies found.  Neck: Trachea normal, normal range of motion and full passive range of motion without pain. Neck supple. Carotid bruit is not present.  Cardiovascular: Normal rate, regular rhythm, normal heart sounds and normal pulses.   Pulmonary/Chest: Effort normal and breath sounds normal. Right breast exhibits no inverted nipple, no mass, no nipple discharge, no skin change and no tenderness. Left breast exhibits no inverted nipple, no mass, no nipple discharge, no skin change and no tenderness. Breasts are symmetrical.  Abdominal: Soft. Normal appearance, normal aorta and bowel sounds are normal. There is no tenderness.  Musculoskeletal: Normal range of motion.  Lymphadenopathy:    She has no cervical adenopathy.    She has no axillary adenopathy.  Neurological: She is alert and oriented to person, place, and time.  Skin: Skin is warm, dry and intact.  Psychiatric: She has a normal mood and affect. Her speech is normal and behavior is normal. Judgment and thought content normal. Cognition and memory are normal.    Activities of Daily Living In your present state of health, do  you have any difficulty performing the following activities: 04/08/2015  Hearing? N  Vision? N  Difficulty concentrating or making decisions? N  Walking or climbing stairs? N  Dressing or bathing? N  Doing errands, shopping? N    Fall Risk Assessment Fall Risk  04/08/2015  Falls in the past year? No     Depression Screen PHQ 2/9 Scores 04/08/2015  PHQ - 2 Score 0    Cognitive Testing - 6-CIT  Correct? Score   What year is it? yes 0 0 or 4  What month is it? yes 0 0 or 3  Memorize:    Pia Mau,  40,  [place],      What time is it? (within 1 hour) yes 0 0 or 3  Count backwards from 20 yes 0  0, 2, or 4  Name the months of the year yes 0 0, 2, or 4  Repeat name & address above no 3 0, 2, 4, 6, 8, or 10       TOTAL SCORE  3/28   Interpretation:  Normal  Normal (0-7) Abnormal (8-28)       Assessment & Plan:     Annual Wellness Visit  Reviewed patient's Family Medical History Reviewed and updated list of patient's medical providers Assessment of cognitive impairment was done Assessed patient's functional ability Established a written schedule for health screening [place] Completed and Reviewed  Exercise Activities and Dietary recommendations Goals    None      Immunization History  Administered Date(s) Administered  . Pneumococcal Conjugate-13 01/20/2014  . Td 09/21/2004  . Tdap 02/27/2011  . Zoster 04/08/2006    Health Maintenance  Topic Date Due  . PNA vac Low Risk Adult (2 of 2 - PPSV23) 01/21/2015  . INFLUENZA VACCINE  01/31/2015  . TETANUS/TDAP  02/26/2021  . DEXA SCAN  Completed  . ZOSTAVAX  Completed      Discussed health benefits of physical activity, and encouraged her to engage in regular exercise appropriate for her age and condition.     1. Medicare annual wellness visit, subsequent As above.  2. Osteopenia Will order.  - DG Bone Density  3. Benign hypertension Condition is stable. Please  continue current medication and  plan of care as noted.   - lisinopril-hydrochlorothiazide (PRINZIDE,ZESTORETIC) 10-12.5 MG tablet; Take 1 tablet by mouth daily.  Dispense: 90 tablet; Refill: 3 - Comprehensive metabolic panel  4. Gastroesophageal reflux disease without esophagitis Stable. Refill medication.  - lansoprazole (PREVACID) 30 MG capsule; 1 Daily  Dispense: 90 capsule; Refill: 3 - CBC with Differential/Platelet  5. Flu vaccine need Stable.  - Flu vaccine HIGH DOSE PF (Fluzone High dose)  6. Hypercholesteremia Will check labs.  - Lipid panel   Patient was seen and examined by Jerrell Belfast, MD, and note scribed by Ashley Royalty, CMA.  I have reviewed the document for accuracy and completeness and I agree with above. Jerrell Belfast, MD   Margarita Rana, MD  ------------------------------------------------------------------------------------------------------------

## 2015-04-12 ENCOUNTER — Telehealth: Payer: Self-pay

## 2015-04-12 LAB — COMPREHENSIVE METABOLIC PANEL
ALK PHOS: 75 IU/L (ref 39–117)
ALT: 6 IU/L (ref 0–32)
AST: 14 IU/L (ref 0–40)
Albumin/Globulin Ratio: 2 (ref 1.1–2.5)
Albumin: 4.4 g/dL (ref 3.5–4.8)
BILIRUBIN TOTAL: 0.3 mg/dL (ref 0.0–1.2)
BUN/Creatinine Ratio: 18 (ref 11–26)
BUN: 17 mg/dL (ref 8–27)
CHLORIDE: 97 mmol/L (ref 97–108)
CO2: 25 mmol/L (ref 18–29)
Calcium: 9.2 mg/dL (ref 8.7–10.3)
Creatinine, Ser: 0.94 mg/dL (ref 0.57–1.00)
GFR calc Af Amer: 68 mL/min/{1.73_m2} (ref >59)
GFR calc non Af Amer: 59 mL/min/{1.73_m2} — ABNORMAL LOW (ref >59)
Globulin, Total: 2.2 g/dL (ref 1.5–4.5)
Glucose: 94 mg/dL (ref 65–99)
POTASSIUM: 4.8 mmol/L (ref 3.5–5.2)
Sodium: 136 mmol/L (ref 134–144)
Total Protein: 6.6 g/dL (ref 6.0–8.5)

## 2015-04-12 LAB — CBC WITH DIFFERENTIAL/PLATELET
Basophils Absolute: 0 10*3/uL (ref 0.0–0.2)
Basos: 0 %
EOS (ABSOLUTE): 0.1 10*3/uL (ref 0.0–0.4)
Eos: 2 %
HEMOGLOBIN: 13.3 g/dL (ref 11.1–15.9)
Hematocrit: 39.6 % (ref 34.0–46.6)
IMMATURE GRANS (ABS): 0 10*3/uL (ref 0.0–0.1)
Immature Granulocytes: 0 %
LYMPHS: 28 %
Lymphocytes Absolute: 1.3 10*3/uL (ref 0.7–3.1)
MCH: 31.4 pg (ref 26.6–33.0)
MCHC: 33.6 g/dL (ref 31.5–35.7)
MCV: 94 fL (ref 79–97)
MONOCYTES: 13 %
Monocytes Absolute: 0.6 10*3/uL (ref 0.1–0.9)
Neutrophils Absolute: 2.7 10*3/uL (ref 1.4–7.0)
Neutrophils: 57 %
Platelets: 267 10*3/uL (ref 150–379)
RBC: 4.23 x10E6/uL (ref 3.77–5.28)
RDW: 13.2 % (ref 12.3–15.4)
WBC: 4.7 10*3/uL (ref 3.4–10.8)

## 2015-04-12 LAB — LIPID PANEL
CHOLESTEROL TOTAL: 204 mg/dL — AB (ref 100–199)
Chol/HDL Ratio: 2.5 ratio units (ref 0.0–4.4)
HDL: 82 mg/dL (ref >39)
LDL CALC: 114 mg/dL — AB (ref 0–99)
Triglycerides: 41 mg/dL (ref 0–149)
VLDL CHOLESTEROL CAL: 8 mg/dL (ref 5–40)

## 2015-04-12 NOTE — Telephone Encounter (Signed)
-----   Message from Margarita Rana, MD sent at 04/12/2015  6:49 AM EDT ----- Cholesterol elevated at 204,  But does have good cholesterol at 80. However, secondary to treatment for hypertension, 10 year risk of heart disease is 20 percent,  So statins are recommended. Please see if patient would like to start a medication or continue lifestyle. Thanks.

## 2015-04-12 NOTE — Telephone Encounter (Signed)
Pt advised as directed below. She would like to work on lifestyle changes first and also get her thyroid under control before starting a new med.  (Dr. Kathyrn Sheriff follows her thyroid)  She would like to recheck in six months.   Thanks,   -Mickel Baas

## 2015-04-20 ENCOUNTER — Ambulatory Visit
Admission: RE | Admit: 2015-04-20 | Discharge: 2015-04-20 | Disposition: A | Discharge status: Home / Self Care | Payer: Medicare Other | Source: Ambulatory Visit | Attending: Family Medicine | Admitting: Family Medicine

## 2015-04-20 DIAGNOSIS — M85852 Other specified disorders of bone density and structure, left thigh: Secondary | ICD-10-CM | POA: Insufficient documentation

## 2015-04-20 DIAGNOSIS — M8580 Other specified disorders of bone density and structure, unspecified site: Secondary | ICD-10-CM | POA: Diagnosis present

## 2015-04-20 DIAGNOSIS — M85832 Other specified disorders of bone density and structure, left forearm: Secondary | ICD-10-CM | POA: Insufficient documentation

## 2015-04-21 ENCOUNTER — Telehealth: Payer: Self-pay

## 2015-04-21 NOTE — Telephone Encounter (Signed)
Left message to call back  

## 2015-04-21 NOTE — Telephone Encounter (Signed)
-----   Message from Margarita Rana, MD sent at 04/20/2015  2:41 PM EDT ----- Still with osteopenia. Borderline for osteoporosis. Right below threshold for starting medication. Please see if patient would like to start medication if has strong concern or strong family history. Or continue lifestyle changes and recheck in 2 years. Thanks.

## 2015-04-22 NOTE — Telephone Encounter (Signed)
Advised pt as directed, pt verbalized fully understanding; pt stated that she wants to wait to start medication, but if you really recommend it she will take it. She also stated that she started to exercise 2 days a week and that she will increase it to daily eventually.    Thanks,

## 2015-04-22 NOTE — Telephone Encounter (Signed)
Hard to truly answer, but ok to wait and recheck at this time.   Thanks.

## 2015-04-22 NOTE — Telephone Encounter (Signed)
Pt returned your call.  Her mobile phone is [phone].  Thanks, [employer]

## 2015-11-14 ENCOUNTER — Other Ambulatory Visit: Payer: Self-pay | Admitting: Family Medicine

## 2015-11-14 DIAGNOSIS — Z1231 Encounter for screening mammogram for malignant neoplasm of breast: Secondary | ICD-10-CM

## 2015-11-22 ENCOUNTER — Other Ambulatory Visit: Payer: Self-pay | Admitting: Family Medicine

## 2015-11-22 ENCOUNTER — Ambulatory Visit
Admission: RE | Admit: 2015-11-22 | Discharge: 2015-11-22 | Disposition: A | Discharge status: Home / Self Care | Payer: Medicare Other | Source: Ambulatory Visit | Attending: Family Medicine | Admitting: Family Medicine

## 2015-11-22 DIAGNOSIS — Z1231 Encounter for screening mammogram for malignant neoplasm of breast: Secondary | ICD-10-CM | POA: Diagnosis present

## 2015-11-23 ENCOUNTER — Telehealth: Payer: Self-pay

## 2015-11-23 NOTE — Telephone Encounter (Signed)
Pt would like the results of her Mammogram.  Contact number is [phone]  Thanks,   -Mickel Baas

## 2015-11-23 NOTE — Telephone Encounter (Signed)
Informed pt. Mallory Sanders, CMA  

## 2015-11-23 NOTE — Telephone Encounter (Signed)
Normal Mammogram. Thanks.

## 2016-03-27 ENCOUNTER — Other Ambulatory Visit: Payer: Self-pay

## 2016-03-27 DIAGNOSIS — I10 Essential (primary) hypertension: Secondary | ICD-10-CM

## 2016-03-27 MED ORDER — LISINOPRIL-HYDROCHLOROTHIAZIDE 10-12.5 MG PO TABS: 1.0000 | ORAL_TABLET | Freq: Every day | ORAL | 3 refills | Status: DC

## 2016-04-09 ENCOUNTER — Encounter: Payer: Medicare Other | Admitting: Family Medicine

## 2016-04-17 ENCOUNTER — Other Ambulatory Visit: Payer: Self-pay

## 2016-04-17 ENCOUNTER — Encounter: Payer: Self-pay | Admitting: Family Medicine

## 2016-04-17 ENCOUNTER — Ambulatory Visit (INDEPENDENT_AMBULATORY_CARE_PROVIDER_SITE_OTHER): Payer: Medicare Other | Admitting: Family Medicine

## 2016-04-17 VITALS — BP 128/80 | HR 68 | Temp 97.7°F | Resp 16 | Ht 64.0 in | Wt 188.0 lb

## 2016-04-17 DIAGNOSIS — Z23 Encounter for immunization: Secondary | ICD-10-CM

## 2016-04-17 DIAGNOSIS — I10 Essential (primary) hypertension: Secondary | ICD-10-CM

## 2016-04-17 DIAGNOSIS — K219 Gastro-esophageal reflux disease without esophagitis: Secondary | ICD-10-CM

## 2016-04-17 DIAGNOSIS — E039 Hypothyroidism, unspecified: Secondary | ICD-10-CM | POA: Diagnosis not present

## 2016-04-17 DIAGNOSIS — Z0000 Encounter for general adult medical examination without abnormal findings: Secondary | ICD-10-CM | POA: Diagnosis not present

## 2016-04-17 DIAGNOSIS — E7800 Pure hypercholesterolemia, unspecified: Secondary | ICD-10-CM

## 2016-04-17 MED ORDER — LISINOPRIL-HYDROCHLOROTHIAZIDE 10-12.5 MG PO TABS: 1.0000 | ORAL_TABLET | Freq: Every day | ORAL | 3 refills | Status: DC

## 2016-04-17 MED ORDER — LANSOPRAZOLE 30 MG PO CPDR: DELAYED_RELEASE_CAPSULE | ORAL | 3 refills | Status: DC

## 2016-04-17 NOTE — Telephone Encounter (Signed)
Pt forgot to mention she needs refills at today's OV. Ok to refill? Renaldo Fiddler, CMA

## 2016-04-17 NOTE — Progress Notes (Signed)
[name], Female    DOB: 1941/05/25, 75 y.o.   MRN: [id number] Visit Date: 04/17/2016  Today's Provider: [name], MD   Chief Complaint  Patient presents with  . Medicare Wellness   Subjective:    Annual wellness visit Mallory Sanders is a 75 y.o. female. She feels well. She reports exercising 2-3 days per week. Is a member of [employer]. She reports she is sleeping well. She had successful umbilical hernia repair in 2015. She is doing well with her medications and it is time for routine lab work follow-up on her high blood pressure and hyperlipidemia. She does have stiffness from her osteoarthritis. Reflux is controlled. She is not sure if she can do without pantoprazole.  ----------------------------------------------------------- Last colonoscopy- 02/01/2015 Last BMD- 04/20/2015 Mammo- 11/22/2015 Pap- 06/14/2009- S/P hysterectomy  Review of Systems  Constitutional: Negative.   HENT: Negative.   Eyes: Negative.   Respiratory: Negative.   Cardiovascular: Negative.   Gastrointestinal: Negative.   Endocrine: Negative.   Genitourinary: Negative.   Musculoskeletal: Negative.   Skin: Negative.   Allergic/Immunologic: Negative.   Neurological: Negative.   Hematological: Negative.   Psychiatric/Behavioral: Negative.     Social History   Social History  . Marital status: Widowed    Spouse name: N/A  . Number of children: 1  . Years of education: Master's   Occupational History  . Retired, Pharmacist, hospital    Social History Main Topics  . Smoking status: Former Smoker    Packs/day: 0.50    Quit date: 07/02/1985  . Smokeless tobacco: Never Used  . Alcohol use Yes     Comment: Social drinker  . Drug use: No  . Sexual activity: Not on file   Other Topics Concern  . Not on file   Social History Narrative   Widowed   Does not get regular exercise    Past Medical History:  Diagnosis Date  . Cancer ([place])    skin cancer on  left side of face  . Gallstone   . Heart murmur 1999  . Hemorrhoids   . Hepatitis A 1962  . Hypertension   . Hypothyroidism    s/p thyroidectomy  . S/P repair of ventral hernia 09/25/2003   Lower abdominal incarcerated ventral hernia repaired with Kugel patch     Patient Active Problem List   Diagnosis Date Noted  . Encounter for screening colonoscopy 01/10/2015  . Abnormal finding on mammography 11/08/2014  . Allergic rhinitis 11/08/2014  . Family history of breast cancer 11/08/2014  . Acid reflux 11/08/2014  . Cardiac murmur 11/08/2014  . Benign hypertension 11/08/2014  . Adiposity 11/08/2014  . Arthritis, degenerative 11/08/2014  . Osteopenia 11/08/2014  . Thyroid nodule 11/08/2014  . Umbilical hernia 0000000  . Hypothyroidism 10/02/2012    Past Surgical History:  Procedure Laterality Date  . ABDOMINAL HYSTERECTOMY  1987  . APPENDECTOMY  1950  . COLONOSCOPY  2005   Dr. Bary Castilla  . COLONOSCOPY WITH PROPOFOL N/A 02/01/2015   Procedure: COLONOSCOPY WITH PROPOFOL;  Surgeon: Robert Bellow, MD;  Location: [facility] ENDOSCOPY;  Service: Endoscopy;  Laterality: N/A;  . HERNIA REPAIR  2005   Lower abdominal incarcerated ventral hernia repaired with Kugel patch  . HERNIA REPAIR  [id number]   umbilical hernia repair  . 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 SKIN CANCER EXCISION  12/2014  . THYROIDECTOMY  08/2005  . TOTAL KNEE ARTHROPLASTY  04/13/2009  [facility], left knee    Her family history includes Breast cancer in her maternal aunt and other; Breast cancer (age of onset: 55) in her mother; Cancer in her mother; Colon cancer in her maternal uncle; Leukemia in her mother; Stroke in her father.    Current Meds  Medication Sig  . aspirin 81 MG tablet Take by mouth.  . Calcium Carbonate-Vitamin D (CALCIUM-VITAMIN D) 500-200 MG-UNIT per tablet Take 1 tablet by mouth 3 (three) times daily.    . fexofenadine (ALLEGRA) 180 MG tablet Take 180 mg by mouth daily.    .  lansoprazole (PREVACID) 30 MG capsule 1 Daily  . levothyroxine (SYNTHROID, LEVOTHROID) 100 MCG tablet   . lisinopril-hydrochlorothiazide (PRINZIDE,ZESTORETIC) 10-12.5 MG tablet Take 1 tablet by mouth daily.  . meclizine (ANTIVERT) 25 MG tablet Take 25 mg by mouth 3 (three) times daily as needed for dizziness.  . Multiple Vitamins-Minerals (PRESERVISION AREDS 2 PO) Take 1 tablet by mouth 2 (two) times daily.  Marland Kitchen triamcinolone (NASACORT ALLERGY 24HR) 55 MCG/ACT AERO nasal inhaler Place 2 sprays into the nose daily.    Patient Care Team: Margarita Rana, MD as PCP - General (Family Medicine) Robert Bellow, MD as Consulting Physician (General Surgery) Margarita Rana, MD as Referring Physician (Family Medicine)    Objective:   Vitals: BP 128/80 (BP Location: Left Arm, Patient Position: Sitting, Cuff Size: Large)   Pulse 68   Temp 97.7 F (36.5 C) (Oral)   Resp 16   Ht 5\' 4"  (1.626 m)   Wt 188 lb (85.3 kg)   BMI 32.27 kg/m   Physical Exam  Constitutional: She is oriented to person, place, and time. She appears well-developed and well-nourished.  HENT:  Head: Normocephalic and atraumatic.  Right Ear: Tympanic membrane, external ear and ear canal normal.  Left Ear: Tympanic membrane, external ear and ear canal normal.  Nose: Nose normal.  Mouth/Throat: Uvula is midline, oropharynx is clear and moist and mucous membranes are normal.  Eyes: Conjunctivae, EOM and lids are normal. Pupils are equal, round, and reactive to light.  Neck: Trachea normal and normal range of motion. Neck supple. Carotid bruit is not present. No thyroid mass and no thyromegaly present.  Cardiovascular: Normal rate, regular rhythm, normal heart sounds and intact distal pulses.   Pulmonary/Chest: Effort normal and breath sounds normal.  Abdominal: Soft. Normal appearance. There is no hepatosplenomegaly. There is no tenderness.  Genitourinary: No breast swelling, tenderness or discharge.  Musculoskeletal: Normal  range of motion. She exhibits no edema.  Lymphadenopathy:    She has no cervical adenopathy.    She has no axillary adenopathy.  Neurological: She is alert and oriented to person, place, and time. She has normal strength. No cranial nerve deficit.  Skin: Skin is warm, dry and intact.  Psychiatric: She has a normal mood and affect. Her speech is normal and behavior is normal. Judgment and thought content normal. Cognition and memory are normal.    Activities of Daily Living In your present state of health, do you have any difficulty performing the following activities: 04/17/2016  Hearing? N  Vision? N  Difficulty concentrating or making decisions? N  Walking or climbing stairs? N  Dressing or bathing? N  Doing errands, shopping? N  Some recent data might be hidden    Fall Risk Assessment Fall Risk  04/17/2016 04/08/2015  Falls in the past year? No No     Depression Screen PHQ 2/9 Scores 04/17/2016 04/08/2015  PHQ - 2 Score 0  0    Cognitive Testing - 6-CIT  Correct? Score   What year is it? yes 0 0 or 4  What month is it? yes 0 0 or 3  Memorize:    Pia Mau,  40,  [address]  [place],      What time is it? (within 1 hour) yes 0 0 or 3  Count backwards from 20 yes 0 0, 2, or 4  Name the months of the year yes 0 0, 2, or 4  Repeat name & address above yes 0 0, 2, 4, 6, 8, or 10       TOTAL SCORE  0/28   Interpretation:  Normal  Normal (0-7) Abnormal (8-28)       Assessment & Plan:     Annual Wellness Visit  Reviewed patient's Family Medical History Reviewed and updated list of patient's medical providers Assessment of cognitive impairment was done Assessed patient's functional ability Established a written schedule for health screening [place] Completed and Reviewed  Exercise Activities and Dietary recommendations Goals    None      Immunization History  Administered Date(s) Administered  . Influenza, High Dose Seasonal PF  04/08/2015  . Pneumococcal Conjugate-13 01/20/2014  . Td 09/21/2004  . Tdap 02/27/2011  . Zoster 04/08/2006    Health Maintenance  Topic Date Due  . PNA vac Low Risk Adult (2 of 2 - PPSV23) 01/21/2015  . INFLUENZA VACCINE  01/31/2016  . TETANUS/TDAP  02/26/2021  . DEXA SCAN  Completed  . ZOSTAVAX  Completed     Overall health is fair and she is advised it is always good to work on good diet and exercise habits. Discussed health benefits of physical activity, and encouraged her to engage in regular exercise appropriate for her age and condition.   Status post umbilical hernia repair Mild obesity Hypertension Hyperlipidemia Osteoarthritis Hypothyroid GERD Try to cut back on pantoprazole use over time and we'll obtain baseline labs for the other problems listed. Overall she is doing fairly well. ------------------------------------------------------------------------------------------------------------    [name], MD  [place]

## 2016-04-18 ENCOUNTER — Telehealth: Payer: Self-pay

## 2016-04-18 LAB — CBC WITH DIFFERENTIAL/PLATELET
BASOS: 1 %
Basophils Absolute: 0 10*3/uL (ref 0.0–0.2)
EOS (ABSOLUTE): 0.1 10*3/uL (ref 0.0–0.4)
EOS: 2 %
HEMATOCRIT: 40.7 % (ref 34.0–46.6)
Hemoglobin: 14 g/dL (ref 11.1–15.9)
IMMATURE GRANULOCYTES: 0 %
Immature Grans (Abs): 0 10*3/uL (ref 0.0–0.1)
LYMPHS ABS: 1.4 10*3/uL (ref 0.7–3.1)
Lymphs: 28 %
MCH: 31.7 pg (ref 26.6–33.0)
MCHC: 34.4 g/dL (ref 31.5–35.7)
MCV: 92 fL (ref 79–97)
MONOS ABS: 0.5 10*3/uL (ref 0.1–0.9)
Monocytes: 10 %
NEUTROS ABS: 3.1 10*3/uL (ref 1.4–7.0)
Neutrophils: 59 %
Platelets: 273 10*3/uL (ref 150–379)
RBC: 4.41 x10E6/uL (ref 3.77–5.28)
RDW: 13.2 % (ref 12.3–15.4)
WBC: 5.1 10*3/uL (ref 3.4–10.8)

## 2016-04-18 LAB — COMPREHENSIVE METABOLIC PANEL
A/G RATIO: 1.9 (ref 1.2–2.2)
ALT: 8 IU/L (ref 0–32)
AST: 18 IU/L (ref 0–40)
Albumin: 4.7 g/dL (ref 3.5–4.8)
Alkaline Phosphatase: 76 IU/L (ref 39–117)
BUN/Creatinine Ratio: 21 (ref 12–28)
BUN: 19 mg/dL (ref 8–27)
Bilirubin Total: 0.3 mg/dL (ref 0.0–1.2)
CALCIUM: 9.4 mg/dL (ref 8.7–10.3)
CO2: 27 mmol/L (ref 18–29)
Chloride: 95 mmol/L — ABNORMAL LOW (ref 96–106)
Creatinine, Ser: 0.92 mg/dL (ref 0.57–1.00)
GFR, EST AFRICAN AMERICAN: 69 mL/min/{1.73_m2} (ref >59)
GFR, EST NON AFRICAN AMERICAN: 60 mL/min/{1.73_m2} (ref >59)
GLOBULIN, TOTAL: 2.5 g/dL (ref 1.5–4.5)
Glucose: 91 mg/dL (ref 65–99)
POTASSIUM: 4.6 mmol/L (ref 3.5–5.2)
SODIUM: 137 mmol/L (ref 134–144)
Total Protein: 7.2 g/dL (ref 6.0–8.5)

## 2016-04-18 LAB — LIPID PANEL
CHOL/HDL RATIO: 2.5 ratio (ref 0.0–4.4)
Cholesterol, Total: 229 mg/dL — ABNORMAL HIGH (ref 100–199)
HDL: 91 mg/dL (ref >39)
LDL Calculated: 125 mg/dL — ABNORMAL HIGH (ref 0–99)
TRIGLYCERIDES: 64 mg/dL (ref 0–149)
VLDL Cholesterol Cal: 13 mg/dL (ref 5–40)

## 2016-04-18 NOTE — Telephone Encounter (Signed)
-----   Message from Jerrol Banana., MD sent at 04/18/2016  1:47 PM EDT ----- WNL

## 2016-04-18 NOTE — Telephone Encounter (Signed)
Advised pt of lab results. Pt verbally acknowledges understanding. Miliana Gangwer Drozdowski, CMA   

## 2016-10-16 ENCOUNTER — Ambulatory Visit: Payer: Medicare Other | Admitting: Family Medicine

## 2016-10-17 ENCOUNTER — Other Ambulatory Visit: Payer: Self-pay | Admitting: Family Medicine

## 2016-10-17 DIAGNOSIS — Z1231 Encounter for screening mammogram for malignant neoplasm of breast: Secondary | ICD-10-CM

## 2016-11-15 ENCOUNTER — Encounter: Payer: Self-pay | Admitting: Family Medicine

## 2016-11-15 ENCOUNTER — Ambulatory Visit (INDEPENDENT_AMBULATORY_CARE_PROVIDER_SITE_OTHER): Payer: Medicare Other | Admitting: Family Medicine

## 2016-11-15 VITALS — BP 128/78 | HR 78 | Temp 97.8°F | Resp 16 | Wt 189.0 lb

## 2016-11-15 DIAGNOSIS — E7800 Pure hypercholesterolemia, unspecified: Secondary | ICD-10-CM | POA: Diagnosis not present

## 2016-11-15 DIAGNOSIS — I10 Essential (primary) hypertension: Secondary | ICD-10-CM

## 2016-11-15 NOTE — Progress Notes (Signed)
[name] Female    DOB: 05-15-1941   76 y.o.   MRN: [id number] Visit Date: 11/15/2016  Today's Provider: [name], MD   Chief Complaint  Patient presents with  . Hypertension  . Hyperlipidemia   Subjective:    HPI  Hypertension, follow-up:  BP Readings from Last 3 Encounters:  11/15/16 128/78  04/17/16 128/80  04/08/15 118/68    She was last seen for hypertension 6 months ago.  BP at that visit was 128/80. Management since that visit includes no changes. She reports good compliance with treatment. She is not having side effects.  She is exercising. She is adherent to low salt diet.   Outside blood pressures are checked weekly. She is experiencing none.  Patient denies exertional chest pressure/discomfort and palpitations.   Cardiovascular risk factors include dyslipidemia.      Weight trend: stable Wt Readings from Last 3 Encounters:  11/15/16 189 lb (85.7 kg)  04/17/16 188 lb (85.3 kg)  04/08/15 188 lb (85.3 kg)    Current diet: well balanced    Lipid/Cholesterol, Follow-up:   Last seen for this6 months ago.  Management changes since that visit include no changes. . Last Lipid Panel:    Component Value Date/Time   CHOL 229 (H) 04/17/2016 1131   TRIG 64 04/17/2016 1131   HDL 91 04/17/2016 1131   CHOLHDL 2.5 04/17/2016 1131   LDLCALC 125 (H) 04/17/2016 1131    Risk factors for vascular disease include hypertension  She reports good compliance with treatment. She is not having side effects.  Current symptoms include none and have been stable. Weight trend: stable Prior visit with dietician: no Current diet: well balanced Current exercise: walking  Wt Readings from Last 3 Encounters:  11/15/16 189 lb (85.7 kg)  04/17/16 188 lb (85.3 kg)  04/08/15 188 lb (85.3 kg)         Allergies  Allergen Reactions  . Propoxyphene N-Acetaminophen   . Streptomycin   . Tramadol Hcl   . Ultra Antioxidant Formula  [Anti-Oxidant] Nausea Only     Current Outpatient Prescriptions:  .  aspirin 81 MG tablet, Take by mouth., Disp: , Rfl:  .  Calcium Carbonate-Vitamin D (CALCIUM-VITAMIN D) 500-200 MG-UNIT per tablet, Take 1 tablet by mouth 3 (three) times daily.  , Disp: , Rfl:  .  fexofenadine (ALLEGRA) 180 MG tablet, Take 180 mg by mouth daily.  , Disp: , Rfl:  .  lansoprazole (PREVACID) 30 MG capsule, 1 Daily, Disp: 90 capsule, Rfl: 3 .  levothyroxine (SYNTHROID, LEVOTHROID) 100 MCG tablet, , Disp: , Rfl:  .  lisinopril-hydrochlorothiazide (PRINZIDE,ZESTORETIC) 10-12.5 MG tablet, Take 1 tablet by mouth daily., Disp: 90 tablet, Rfl: 3 .  meclizine (ANTIVERT) 25 MG tablet, Take 25 mg by mouth 3 (three) times daily as needed for dizziness., Disp: , Rfl:  .  Multiple Vitamins-Minerals (PRESERVISION AREDS 2 PO), Take 1 tablet by mouth 2 (two) times daily., Disp: , Rfl:  .  Multiple Vitamins-Minerals (PRESERVISION AREDS 2) CAPS, Take by mouth., Disp: , Rfl:  .  triamcinolone (NASACORT ALLERGY 24HR) 55 MCG/ACT AERO nasal inhaler, Place 2 sprays into the nose daily., Disp: , Rfl:   Review of Systems  Constitutional: Negative.   Eyes: Negative.   Respiratory: Negative.   Cardiovascular: Negative.   Endocrine: Negative.   Musculoskeletal: Negative.   Allergic/Immunologic: Negative.   Neurological: Negative.   Psychiatric/Behavioral: Negative.     Social History  Substance Use  Topics  . Smoking status: Former Smoker    Packs/day: 0.50    Quit date: 07/02/1985  . Smokeless tobacco: Never Used  . Alcohol use Yes     Comment: Social drinker   Objective:   BP 128/78 (BP Location: Left Arm, Patient Position: Sitting, Cuff Size: Normal)   Pulse 78   Temp 97.8 F (36.6 C)   Resp 16   Wt 189 lb (85.7 kg)   SpO2 99%   BMI 32.44 kg/m  Vitals:   11/15/16 1155  BP: 128/78  Pulse: 78  Resp: 16  Temp: 97.8 F (36.6 C)  SpO2: 99%  Weight: 189 lb (85.7 kg)     Physical Exam  Constitutional: She is  oriented to person, place, and time. She appears well-developed and well-nourished.  HENT:  Head: Normocephalic and atraumatic.  Eyes: Conjunctivae are normal. No scleral icterus.  Neck: Normal range of motion. Neck supple. No thyromegaly present.  Cardiovascular: Normal rate, regular rhythm and normal heart sounds.   Pulmonary/Chest: Effort normal and breath sounds normal.  Abdominal: Soft.  Neurological: She is alert and oriented to person, place, and time.  Skin: Skin is warm and dry.  Psychiatric: She has a normal mood and affect. Her behavior is normal. Thought content normal.        Assessment & Plan:     HTN HLD CPE 6 months.  I have done the exam and reviewed the chart and it is accurate to the best of my knowledge. Development worker, community has been used and  any errors in dictation or transcription are unintentional. Miguel Aschoff M.D. [place], MD  [facility]

## 2016-11-27 ENCOUNTER — Ambulatory Visit
Admission: RE | Admit: 2016-11-27 | Discharge: 2016-11-27 | Disposition: A | Discharge status: Home / Self Care | Payer: Medicare Other | Source: Ambulatory Visit | Attending: Family Medicine | Admitting: Family Medicine

## 2016-11-27 DIAGNOSIS — Z1231 Encounter for screening mammogram for malignant neoplasm of breast: Secondary | ICD-10-CM | POA: Insufficient documentation

## 2016-12-28 ENCOUNTER — Other Ambulatory Visit: Payer: Self-pay | Admitting: Family Medicine

## 2016-12-28 DIAGNOSIS — I10 Essential (primary) hypertension: Secondary | ICD-10-CM

## 2016-12-29 ENCOUNTER — Other Ambulatory Visit: Payer: Self-pay | Admitting: Family Medicine

## 2016-12-29 DIAGNOSIS — K219 Gastro-esophageal reflux disease without esophagitis: Secondary | ICD-10-CM

## 2017-04-09 ENCOUNTER — Telehealth: Payer: Self-pay | Admitting: Family Medicine

## 2017-04-24 ENCOUNTER — Ambulatory Visit (INDEPENDENT_AMBULATORY_CARE_PROVIDER_SITE_OTHER): Payer: Medicare Other

## 2017-04-24 ENCOUNTER — Encounter: Payer: Self-pay | Admitting: Family Medicine

## 2017-04-24 ENCOUNTER — Ambulatory Visit (INDEPENDENT_AMBULATORY_CARE_PROVIDER_SITE_OTHER): Payer: Medicare Other | Admitting: Family Medicine

## 2017-04-24 VITALS — BP 128/82 | HR 80 | Temp 97.9°F | Wt 190.4 lb

## 2017-04-24 VITALS — BP 152/72 | HR 80 | Temp 97.9°F | Ht 64.0 in | Wt 190.4 lb

## 2017-04-24 DIAGNOSIS — Z1211 Encounter for screening for malignant neoplasm of colon: Secondary | ICD-10-CM | POA: Diagnosis not present

## 2017-04-24 DIAGNOSIS — Z0000 Encounter for general adult medical examination without abnormal findings: Secondary | ICD-10-CM | POA: Diagnosis not present

## 2017-04-24 DIAGNOSIS — I10 Essential (primary) hypertension: Secondary | ICD-10-CM | POA: Diagnosis not present

## 2017-04-24 DIAGNOSIS — E7800 Pure hypercholesterolemia, unspecified: Secondary | ICD-10-CM

## 2017-04-24 LAB — CBC WITH DIFFERENTIAL/PLATELET
BASOS PCT: 0.9 %
Basophils Absolute: 38 cells/uL (ref 0–200)
EOS PCT: 1.2 %
Eosinophils Absolute: 50 cells/uL (ref 15–500)
HCT: 39.5 % (ref 35.0–45.0)
Hemoglobin: 13.3 g/dL (ref 11.7–15.5)
LYMPHS ABS: 1033 {cells}/uL (ref 850–3900)
MCH: 31.6 pg (ref 27.0–33.0)
MCHC: 33.7 g/dL (ref 32.0–36.0)
MCV: 93.8 fL (ref 80.0–100.0)
MONOS PCT: 11.8 %
MPV: 9.4 fL (ref 7.5–12.5)
Neutro Abs: 2583 cells/uL (ref 1500–7800)
Neutrophils Relative %: 61.5 %
PLATELETS: 250 10*3/uL (ref 140–400)
RBC: 4.21 10*6/uL (ref 3.80–5.10)
RDW: 12.2 % (ref 11.0–15.0)
Total Lymphocyte: 24.6 %
WBC mixed population: 496 cells/uL (ref 200–950)
WBC: 4.2 10*3/uL (ref 3.8–10.8)

## 2017-04-24 LAB — COMPLETE METABOLIC PANEL WITH GFR
AG Ratio: 1.9 (calc) (ref 1.0–2.5)
ALBUMIN MSPROF: 4.6 g/dL (ref 3.6–5.1)
ALT: 8 U/L (ref 6–29)
AST: 16 U/L (ref 10–35)
Alkaline phosphatase (APISO): 57 U/L (ref 33–130)
BILIRUBIN TOTAL: 0.4 mg/dL (ref 0.2–1.2)
BUN / CREAT RATIO: 19 (calc) (ref 6–22)
BUN: 18 mg/dL (ref 7–25)
CHLORIDE: 98 mmol/L (ref 98–110)
CO2: 29 mmol/L (ref 20–32)
Calcium: 9.3 mg/dL (ref 8.6–10.4)
Creat: 0.96 mg/dL — ABNORMAL HIGH (ref 0.60–0.93)
GFR, Est African American: 66 mL/min/{1.73_m2} (ref >=60)
GFR, Est Non African American: 57 mL/min/{1.73_m2} — ABNORMAL LOW (ref >=60)
GLOBULIN: 2.4 g/dL (ref 1.9–3.7)
GLUCOSE: 98 mg/dL (ref 65–99)
Potassium: 4.5 mmol/L (ref 3.5–5.3)
SODIUM: 135 mmol/L (ref 135–146)
TOTAL PROTEIN: 7 g/dL (ref 6.1–8.1)

## 2017-04-24 LAB — LIPID PANEL
CHOLESTEROL: 217 mg/dL — AB (ref <200)
HDL: 105 mg/dL (ref >50)
LDL CHOLESTEROL (CALC): 98 mg/dL
Non-HDL Cholesterol (Calc): 112 mg/dL (calc) (ref <130)
Total CHOL/HDL Ratio: 2.1 (calc) (ref <5.0)
Triglycerides: 56 mg/dL (ref <150)

## 2017-04-24 NOTE — Progress Notes (Signed)
Subjective:   Mallory Sanders is a 76 y.o. female who presents for Medicare Annual (Subsequent) preventive examination.  Review of Systems:  N/A  Cardiac Risk Factors include: advanced age (>33men, >54 women);dyslipidemia;hypertension;obesity (BMI >30kg/m2)     Objective:     Vitals: BP (!) 152/72 (BP Location: Left Arm)   Pulse 80   Temp 97.9 F (36.6 C) (Oral)   Ht 5\' 4"  (1.626 m)   Wt 190 lb 6.4 oz (86.4 kg)   BMI 32.68 kg/m   Body mass index is 32.68 kg/m.   Tobacco History  Smoking Status  . Former Smoker  . Packs/day: 0.50  . Quit date: 07/02/1985  Smokeless Tobacco  . Never Used     Counseling given: Not Answered   Past Medical History:  Diagnosis Date  . Cancer ([place])    skin cancer on left side of face  . Gallstone   . Heart murmur 1999  . Hemorrhoids   . Hepatitis A 1962  . Hypertension   . Hypothyroidism    s/p thyroidectomy  . S/P repair of ventral hernia 09/25/2003   Lower abdominal incarcerated ventral hernia repaired with Kugel patch   Past Surgical History:  Procedure Laterality Date  . ABDOMINAL HYSTERECTOMY  1987  . APPENDECTOMY  1950  . COLONOSCOPY  2005   Dr. Bary Castilla  . COLONOSCOPY WITH PROPOFOL N/A 02/01/2015   Procedure: COLONOSCOPY WITH PROPOFOL;  Surgeon: Robert Bellow, MD;  [facility];  Service: Endoscopy;  Laterality: N/A;  . HERNIA REPAIR  2005   Lower abdominal incarcerated ventral hernia repaired with Kugel patch  . HERNIA REPAIR  3/50/09   umbilical hernia repair  . 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 SKIN CANCER EXCISION  12/2014  . THYROIDECTOMY  08/2005  . TOTAL KNEE ARTHROPLASTY  04/13/2009   [facility], left knee   Family History  Problem Relation Age of Onset  . Cancer Mother   . Breast cancer Mother 2  . Leukemia Mother   . Stroke Father   . Breast cancer Maternal Aunt   . Colon cancer Maternal Uncle   . Breast cancer Cousin   . Breast cancer Cousin   . Coronary artery disease Neg Hx     History  Sexual Activity  . Sexual activity: Not on file    Outpatient Encounter Prescriptions as of 04/24/2017  Medication Sig  . aspirin 81 MG tablet Take 81 mg by mouth daily.   . Calcium Carbonate-Vitamin D (CALCIUM-VITAMIN D) 500-200 MG-UNIT per tablet Take 1 tablet by mouth 3 (three) times daily.    . fexofenadine (ALLEGRA) 180 MG tablet Take 180 mg by mouth daily.   . lansoprazole (PREVACID) 30 MG capsule TAKE 1 CAPSULE BY MOUTH EVERY DAY  . levothyroxine (SYNTHROID, LEVOTHROID) 100 MCG tablet Take 100 mcg by mouth daily before breakfast.   . lisinopril-hydrochlorothiazide (PRINZIDE,ZESTORETIC) 10-12.5 MG tablet TAKE ONE TABLET BY MOUTH EVERY DAY  . meclizine (ANTIVERT) 25 MG tablet Take 25 mg by mouth 3 (three) times daily as needed for dizziness.  . Multiple Vitamins-Minerals (PRESERVISION AREDS 2 PO) Take 1 tablet by mouth 2 (two) times daily.  Marland Kitchen triamcinolone (NASACORT ALLERGY 24HR) 55 MCG/ACT AERO nasal inhaler Place 2 sprays into the nose daily.   . [DISCONTINUED] Multiple Vitamins-Minerals (PRESERVISION AREDS 2) CAPS Take by mouth.   No facility-administered encounter medications on file as of 04/24/2017.     Activities of Daily Living In your present state of health,  do you have any difficulty performing the following activities: 04/24/2017  Hearing? N  Vision? N  Difficulty concentrating or making decisions? N  Walking or climbing stairs? N  Dressing or bathing? N  Doing errands, shopping? N  Preparing Food and eating ? N  Using the Toilet? N  In the past six months, have you accidently leaked urine? Y  Comment has prolapsed bladder, wears protection  Do you have problems with loss of bowel control? N  Managing your Medications? N  Managing your Finances? N  Housekeeping or managing your Housekeeping? N  Some recent data might be hidden    Patient Care Team: Jerrol Banana., MD as PCP - General (Family Medicine) Bary Castilla, Forest Gleason, MD as Consulting  Physician (General Surgery) Eulogio Bear, MD as Consulting Physician (Ophthalmology) Margaretha Sheffield, MD as Consulting Physician (Otolaryngology) Oneta Rack, MD as Consulting Physician (Dermatology)    Assessment:     Exercise Activities and Dietary recommendations Current Exercise Habits: Structured exercise class, Type of exercise: strength training/weights;stretching;walking, Time (Minutes): 45, Frequency (Times/Week): 2, Weekly Exercise (Minutes/Week): 90, Intensity: Mild, Exercise limited by: None identified  Goals    . Cut out extra servings          Recommend to eat 3 balanced meals a day with two healthy snack in between. Recommend to cut out extra snacks.        Fall Risk Fall Risk  04/24/2017 04/17/2016 04/08/2015  Falls in the past year? No No No   Depression Screen PHQ 2/9 Scores 04/24/2017 04/24/2017 04/17/2016 04/08/2015  PHQ - 2 Score 0 0 0 0  PHQ- 9 Score 0 - - -     Cognitive Function: Pt declined screening today.        Immunization History  Administered Date(s) Administered  . Influenza, High Dose Seasonal PF 04/08/2015, 04/17/2016, 04/01/2017  . Pneumococcal Conjugate-13 01/20/2014  . Pneumococcal Polysaccharide-23 04/17/2016  . Td 09/21/2004  . Tdap 02/27/2011  . Zoster 04/08/2006   Screening Tests Health Maintenance  Topic Date Due  . TETANUS/TDAP  02/26/2021  . INFLUENZA VACCINE  Completed  . DEXA SCAN  Completed  . PNA vac Low Risk Adult  Completed      Plan:  I have personally reviewed and addressed the Medicare Annual Wellness questionnaire and have noted the following in the patient's chart:  A. Medical and social history B. Use of alcohol, tobacco or illicit drugs  C. Current medications and supplements D. Functional ability and status E.  Nutritional status F.  Physical activity G. Advance directives H. List of other physicians I.  Hospitalizations, surgeries, and ER visits in previous 12 months J.   [place] such as hearing and vision if needed, cognitive and depression L. Referrals and appointments - none  In addition, I have reviewed and discussed with patient certain preventive protocols, quality metrics, and best practice recommendations. A written personalized care plan for preventive services as well as general preventive health recommendations were provided to patient.  See attached scanned questionnaire for additional information.   Signed,  Fabio Neighbors, LPN Nurse Health Advisor    MD Recommendations: None.

## 2017-04-24 NOTE — Progress Notes (Signed)
[name], Female    DOB: 1940-11-25, 76 y.o.   MRN: [id number] Visit Date: 04/24/2017  Today's Provider: [name], MD   Chief Complaint  Patient presents with  . Annual Exam   Subjective:    Annual physical exam Mallory Sanders is a 76 y.o. female who presents today for health maintenance and complete physical. She feels well. She reports exercising twice a week, classes at the hospital then walking daily. She reports she is sleeping well.  -----------------------------------------------------------------  Colonoscopy- 02/01/15 normal repeat 10 years Mammogram- 11/27/16 negative BMD- 04/20/15 osteopenia   Review of Systems  Constitutional: Negative.   HENT: Negative.   Eyes: Negative.   Respiratory: Negative.   Cardiovascular: Negative.   Gastrointestinal: Negative.        Recent hemorrhoidal bleeding per pt.  Endocrine: Negative.   Genitourinary: Negative.   Musculoskeletal: Negative.   Skin: Negative.   Allergic/Immunologic: Negative.   Neurological: Negative.   Hematological: Negative.   Psychiatric/Behavioral: Negative.     Social History      She  reports that she quit smoking about 31 years ago. She smoked 0.50 packs per day. She has never used smokeless tobacco. She reports that she drinks alcohol. She reports that she does not use drugs.       Social History   Social History  . Marital status: Widowed    Spouse name: N/A  . Number of children: 1  . Years of education: Master's   Occupational History  . Retired, Pharmacist, hospital    Social History Main Topics  . Smoking status: Former Smoker    Packs/day: 0.50    Quit date: 07/02/1985  . Smokeless tobacco: Never Used  . Alcohol use Yes     Comment: Social drinker /  monthly  . Drug use: No  . Sexual activity: Not Asked   Other Topics Concern  . None   Social History Narrative   Widowed   Does not get regular exercise    Past Medical History:  Diagnosis Date  . Cancer  ([place])    skin cancer on left side of face  . Gallstone   . Heart murmur 1999  . Hemorrhoids   . Hepatitis A 1962  . Hypertension   . Hypothyroidism    s/p thyroidectomy  . S/P repair of ventral hernia 09/25/2003   Lower abdominal incarcerated ventral hernia repaired with Kugel patch     Patient Active Problem List   Diagnosis Date Noted  . Hypercholesterolemia 04/17/2016  . Encounter for screening colonoscopy 01/10/2015  . Abnormal finding on mammography 11/08/2014  . Allergic rhinitis 11/08/2014  . Family history of breast cancer 11/08/2014  . Acid reflux 11/08/2014  . Cardiac murmur 11/08/2014  . Benign hypertension 11/08/2014  . Adiposity 11/08/2014  . Arthritis, degenerative 11/08/2014  . Osteopenia 11/08/2014  . Thyroid nodule 11/08/2014  . Umbilical hernia 50/03/3817  . Hypothyroidism 10/02/2012    Past Surgical History:  Procedure Laterality Date  . ABDOMINAL HYSTERECTOMY  1987  . APPENDECTOMY  1950  . COLONOSCOPY  2005   Dr. Bary Castilla  . COLONOSCOPY WITH PROPOFOL N/A 02/01/2015   Procedure: COLONOSCOPY WITH PROPOFOL;  Surgeon: Robert Bellow, MD;  Location: [facility] ENDOSCOPY;  Service: Endoscopy;  Laterality: N/A;  . HERNIA REPAIR  2005   Lower abdominal incarcerated ventral hernia repaired with Kugel patch  . HERNIA REPAIR  2/99/37   umbilical hernia repair  . REPLACEMENT TOTAL KNEE  04/26/10  right  . SKIN CANCER EXCISION  12/2014  . THYROIDECTOMY  08/2005  . TOTAL KNEE ARTHROPLASTY  04/13/2009   [facility], left knee    Family History        Family Status  Relation Status  . Mother Deceased at age 29       Leukemia  . Father Deceased at age 81  . Mat Aunt Deceased  . Mat Uncle Deceased  . Cousin (Not Specified)  . Cousin (Not Specified)  . Neg Hx (Not Specified)        Her family history includes Breast cancer in her cousin, cousin, and maternal aunt; Breast cancer (age of onset: 89) in her mother; Cancer in her mother; Colon cancer in her maternal  uncle; Leukemia in her mother; Stroke in her father.     Allergies  Allergen Reactions  . Propoxyphene N-Acetaminophen   . Streptomycin   . Tramadol Hcl   . Ultra Antioxidant Formula [Anti-Oxidant] Nausea Only     Current Outpatient Prescriptions:  .  aspirin 81 MG tablet, Take 81 mg by mouth daily. , Disp: , Rfl:  .  Calcium Carbonate-Vitamin D (CALCIUM-VITAMIN D) 500-200 MG-UNIT per tablet, Take 1 tablet by mouth 3 (three) times daily.  , Disp: , Rfl:  .  fexofenadine (ALLEGRA) 180 MG tablet, Take 180 mg by mouth daily. , Disp: , Rfl:  .  lansoprazole (PREVACID) 30 MG capsule, TAKE 1 CAPSULE BY MOUTH EVERY DAY, Disp: 90 capsule, Rfl: 1 .  levothyroxine (SYNTHROID, LEVOTHROID) 100 MCG tablet, Take 100 mcg by mouth daily before breakfast. , Disp: , Rfl:  .  lisinopril-hydrochlorothiazide (PRINZIDE,ZESTORETIC) 10-12.5 MG tablet, TAKE ONE TABLET BY MOUTH EVERY DAY, Disp: 90 tablet, Rfl: 3 .  meclizine (ANTIVERT) 25 MG tablet, Take 25 mg by mouth 3 (three) times daily as needed for dizziness., Disp: , Rfl:  .  Multiple Vitamins-Minerals (PRESERVISION AREDS 2 PO), Take 1 tablet by mouth 2 (two) times daily., Disp: , Rfl:  .  triamcinolone (NASACORT ALLERGY 24HR) 55 MCG/ACT AERO nasal inhaler, Place 2 sprays into the nose daily. , Disp: , Rfl:    Patient Care Team: Jerrol Banana., MD as PCP - General (Family Medicine) Bary Castilla, Forest Gleason, MD as Consulting Physician (General Surgery) Eulogio Bear, MD as Consulting Physician (Ophthalmology) Margaretha Sheffield, MD as Consulting Physician (Otolaryngology) Oneta Rack, MD as Consulting Physician (Dermatology)      Objective:   Vitals: BP 128/82   Pulse 80   Temp 97.9 F (36.6 C) (Oral)   Wt 190 lb 6.4 oz (86.4 kg)   BMI 32.68 kg/m    Vitals:   04/24/17 0949  BP: 128/82  Pulse: 80  Temp: 97.9 F (36.6 C)  TempSrc: Oral  Weight: 190 lb 6.4 oz (86.4 kg)     Physical Exam  Constitutional: She is oriented to person,  place, and time. She appears well-developed and well-nourished.  WNWDWF appears younger than her age.  HENT:  Head: Normocephalic and atraumatic.  Right Ear: External ear normal.  Left Ear: External ear normal.  Nose: Nose normal.  Mouth/Throat: Oropharynx is clear and moist.  Eyes: Conjunctivae are normal. No scleral icterus.  Neck: No thyromegaly present.  Cardiovascular: Normal rate, regular rhythm, normal heart sounds and intact distal pulses.   Pulmonary/Chest: Effort normal and breath sounds normal.  Breasts benign.  Abdominal: Soft.  Musculoskeletal: Normal range of motion.  Neurological: She is alert and oriented to person, place, and time.  Skin: Skin  is warm and dry.  Psychiatric: She has a normal mood and affect. Her behavior is normal. Judgment and thought content normal.     Depression Screen PHQ 2/9 Scores 04/24/2017 04/24/2017 04/17/2016 04/08/2015  PHQ - 2 Score 0 0 0 0  PHQ- 9 Score 0 - - -      Assessment & Plan:     Routine Health Maintenance and Physical Exam  Exercise Activities and Dietary recommendations Goals    . Cut out extra servings          Recommend to eat 3 balanced meals a day with two healthy snack in between. Recommend to cut out extra snacks.        Immunization History  Administered Date(s) Administered  . Influenza, High Dose Seasonal PF 04/08/2015, 04/17/2016, 04/01/2017  . Pneumococcal Conjugate-13 01/20/2014  . Pneumococcal Polysaccharide-23 04/17/2016  . Td 09/21/2004  . Tdap 02/27/2011  . Zoster 04/08/2006    Health Maintenance  Topic Date Due  . Samul Dada  02/26/2021  . INFLUENZA VACCINE  Completed  . DEXA SCAN  Completed  . PNA vac Low Risk Adult  Completed     Discussed health benefits of physical activity, and encouraged her to engage in regular exercise appropriate for her age and condition.  S/p Thyroidectomy/Umbilical Hernia repair/Appendectomy/Hysterectomy     --------------------------------------------------------------------   I have done the exam and reviewed the above chart and it is accurate to the best of my knowledge. Development worker, community has been used in this note in any air is in the dictation or transcription are unintentional.  [name], MD  [place]

## 2017-04-24 NOTE — Patient Instructions (Signed)
Mallory Sanders , Thank you for taking time to come for your Medicare Wellness Visit. I appreciate your ongoing commitment to your health goals. Please review the following plan we discussed and let me know if I can assist you in the future.   Screening recommendations/referrals: Colonoscopy: Up to date Mammogram: Up to date Bone Density: Up to date Recommended yearly ophthalmology/optometry visit for glaucoma screening and checkup Recommended yearly dental visit for hygiene and checkup  Vaccinations: Influenza vaccine: completed Pneumococcal vaccine: completed series Tdap vaccine: Up to date Shingles vaccine: completed 04/08/06  Advanced directives: Please bring a copy of your POA (Power of [place]) and/or Living Will to your next appointment.   Conditions/risks identified: Recommend to eat 3 balanced meals a day with two healthy snack in between. Recommend to cut out extra snacks.   Next appointment: 10:00 AM today   Preventive Care 78 Years and Older, Female Preventive care refers to lifestyle choices and visits with your health care provider that can promote health and wellness. What does preventive care include?  A yearly physical exam. This is also called an annual well check.  Dental exams once or twice a year.  Routine eye exams. Ask your health care provider how often you should have your eyes checked.  Personal lifestyle choices, including:  Daily care of your teeth and gums.  Regular physical activity.  Eating a healthy diet.  Avoiding tobacco and drug use.  Limiting alcohol use.  Practicing safe sex.  Taking low-dose aspirin every day.  Taking vitamin and mineral supplements as recommended by your health care provider. What happens during an annual well check? The services and screenings done by your health care provider during your annual well check will depend on your age, overall health, lifestyle risk factors, and family history of disease. Counseling    Your health care provider may ask you questions about your:  Alcohol use.  Tobacco use.  Drug use.  Emotional well-being.  Home and relationship well-being.  Sexual activity.  Eating habits.  History of falls.  Memory and ability to understand (cognition).  Work and work Statistician.  Reproductive health. Screening  You may have the following tests or measurements:  Height, weight, and BMI.  Blood pressure.  Lipid and cholesterol levels. These may be checked every 5 years, or more frequently if you are over 40 years old.  Skin check.  Lung cancer screening. You may have this screening every year starting at age 78 if you have a 30-pack-year history of smoking and currently smoke or have quit within the past 15 years.  Fecal occult blood test (FOBT) of the stool. You may have this test every year starting at age 78  Flexible sigmoidoscopy or colonoscopy. You may have a sigmoidoscopy every 5 years or a colonoscopy every 10 years starting at age 78  Hepatitis C blood test.  Hepatitis B blood test.  Sexually transmitted disease (STD) testing.  Diabetes screening. This is done by checking your blood sugar (glucose) after you have not eaten for a while (fasting). You may have this done every 1-3 years.  Bone density scan. This is done to screen for osteoporosis. You may have this done starting at age 78  Mammogram. This may be done every 78-2 years. Talk to your health care provider about how often you should have regular mammograms. Talk with your health care provider about your test results, treatment options, and if necessary, the need for more tests. Vaccines  Your health care provider may  recommend certain vaccines, such as:  Influenza vaccine. This is recommended every year.  Tetanus, diphtheria, and acellular pertussis (Tdap, Td) vaccine. You may need a Td booster every 10 years.  Zoster vaccine. You may need this after age 78.  Pneumococcal 13-valent  conjugate (PCV13) vaccine. One dose is recommended after age 78.  Pneumococcal polysaccharide (PPSV23) vaccine. One dose is recommended after age 78. Talk to your health care provider about which screenings and vaccines you need and how often you need them. This information is not intended to replace advice given to you by your health care provider. Make sure you discuss any questions you have with your health care provider. Document Released: 07/15/2015 Document Revised: 03/07/2016 Document Reviewed: 04/19/2015 Elsevier Interactive Patient Education  2017 [place] Prevention in the Home Falls can cause injuries. They can happen to people of all ages. There are many things you can do to make your home safe and to help prevent falls. What can I do on the outside of my home?  Regularly fix the edges of walkways and driveways and fix any cracks.  Remove anything that might make you trip as you walk through a door, such as a raised step or threshold.  Trim any bushes or trees on the path to your home.  Use bright outdoor lighting.  Clear any walking paths of anything that might make someone trip, such as rocks or tools.  Regularly check to see if handrails are loose or broken. Make sure that both sides of any steps have handrails.  Any raised decks and porches should have guardrails on the edges.  Have any leaves, snow, or ice cleared regularly.  Use sand or salt on walking paths during winter.  Clean up any spills in your garage right away. This includes oil or grease spills. What can I do in the bathroom?  Use night lights.  Install grab bars by the toilet and in the tub and shower. Do not use towel bars as grab bars.  Use non-skid mats or decals in the tub or shower.  If you need to sit down in the shower, use a plastic, non-slip stool.  Keep the floor dry. Clean up any water that spills on the floor as soon as it happens.  Remove soap buildup in the tub or  shower regularly.  Attach bath mats securely with double-sided non-slip rug tape.  Do not have throw rugs and other things on the floor that can make you trip. What can I do in the bedroom?  Use night lights.  Make sure that you have a light by your bed that is easy to reach.  Do not use any sheets or blankets that are too big for your bed. They should not hang down onto the floor.  Have a firm chair that has side arms. You can use this for support while you get dressed.  Do not have throw rugs and other things on the floor that can make you trip. What can I do in the kitchen?  Clean up any spills right away.  Avoid walking on wet floors.  Keep items that you use a lot in easy-to-reach places.  If you need to reach something above you, use a strong step stool that has a grab bar.  Keep electrical cords out of the way.  Do not use floor polish or wax that makes floors slippery. If you must use wax, use non-skid floor wax.  Do not have throw rugs and  other things on the floor that can make you trip. What can I do with my stairs?  Do not leave any items on the stairs.  Make sure that there are handrails on both sides of the stairs and use them. Fix handrails that are broken or loose. Make sure that handrails are as long as the stairways.  Check any carpeting to make sure that it is firmly attached to the stairs. Fix any carpet that is loose or worn.  Avoid having throw rugs at the top or bottom of the stairs. If you do have throw rugs, attach them to the floor with carpet tape.  Make sure that you have a light switch at the top of the stairs and the bottom of the stairs. If you do not have them, ask someone to add them for you. What else can I do to help prevent falls?  Wear shoes that:  Do not have high heels.  Have rubber bottoms.  Are comfortable and fit you well.  Are closed at the toe. Do not wear sandals.  If you use a stepladder:  Make sure that it is fully  opened. Do not climb a closed stepladder.  Make sure that both sides of the stepladder are locked into place.  Ask someone to hold it for you, if possible.  Clearly mark and make sure that you can see:  Any grab bars or handrails.  First and last steps.  Where the edge of each step is.  Use tools that help you move around (mobility aids) if they are needed. These include:  Canes.  Walkers.  Scooters.  Crutches.  Turn on the lights when you go into a dark area. Replace any light bulbs as soon as they burn out.  Set up your furniture so you have a clear path. Avoid moving your furniture around.  If any of your floors are uneven, fix them.  If there are any pets around you, be aware of where they are.  Review your medicines with your doctor. Some medicines can make you feel dizzy. This can increase your chance of falling. Ask your doctor what other things that you can do to help prevent falls. This information is not intended to replace advice given to you by your health care provider. Make sure you discuss any questions you have with your health care provider. Document Released: 04/14/2009 Document Revised: 11/24/2015 Document Reviewed: 07/23/2014 Elsevier Interactive Patient Education  2017 [employer].

## 2017-04-29 ENCOUNTER — Telehealth: Payer: Self-pay

## 2017-04-29 NOTE — Telephone Encounter (Signed)
-----   Message from Jerrol Banana., MD sent at 04/26/2017 10:06 AM EDT ----- Stable.

## 2017-04-29 NOTE — Telephone Encounter (Signed)
Pt advised.   Thanks,   -Laura  

## 2017-06-05 ENCOUNTER — Telehealth: Payer: Self-pay | Admitting: Family Medicine

## 2017-06-05 NOTE — Telephone Encounter (Signed)
Patient has Medicare. Okay for RX for shingles vaccine at the pharmacy? Please advise.

## 2017-06-05 NOTE — Telephone Encounter (Signed)
P is requesting the shingles shot.  Please advise.  CB#[phone]/MW

## 2017-06-06 NOTE — Telephone Encounter (Signed)
Ok with me 

## 2017-06-06 NOTE — Telephone Encounter (Signed)
lmtcb-Noriel Guthrie V Redford Behrle, RMA  

## 2017-06-06 NOTE — Telephone Encounter (Signed)
Pt returned call. Thanks TNP °

## 2017-06-07 NOTE — Telephone Encounter (Signed)
[name] V [name], RMA  

## 2017-06-07 NOTE — Telephone Encounter (Signed)
Pt informed. appt scheduled.

## 2017-06-19 NOTE — Telephone Encounter (Signed)
complete

## 2017-06-26 ENCOUNTER — Other Ambulatory Visit: Payer: Self-pay | Admitting: Family Medicine

## 2017-06-26 DIAGNOSIS — K219 Gastro-esophageal reflux disease without esophagitis: Secondary | ICD-10-CM

## 2017-07-08 ENCOUNTER — Ambulatory Visit (INDEPENDENT_AMBULATORY_CARE_PROVIDER_SITE_OTHER): Payer: Medicare Other | Admitting: Family Medicine

## 2017-07-08 DIAGNOSIS — Z23 Encounter for immunization: Secondary | ICD-10-CM

## 2017-09-09 ENCOUNTER — Ambulatory Visit (INDEPENDENT_AMBULATORY_CARE_PROVIDER_SITE_OTHER): Payer: Medicare Other | Admitting: Family Medicine

## 2017-09-09 DIAGNOSIS — Z23 Encounter for immunization: Secondary | ICD-10-CM

## 2017-09-09 NOTE — Progress Notes (Signed)
Vaccine only

## 2017-10-23 ENCOUNTER — Other Ambulatory Visit: Payer: Self-pay

## 2017-10-23 ENCOUNTER — Ambulatory Visit: Payer: Medicare Other | Admitting: Family Medicine

## 2017-10-23 VITALS — BP 140/82 | HR 68 | Temp 97.7°F | Resp 16 | Wt 188.0 lb

## 2017-10-23 DIAGNOSIS — E039 Hypothyroidism, unspecified: Secondary | ICD-10-CM

## 2017-10-23 DIAGNOSIS — I10 Essential (primary) hypertension: Secondary | ICD-10-CM

## 2017-10-23 MED ORDER — LISINOPRIL-HYDROCHLOROTHIAZIDE 10-12.5 MG PO TABS: 1.0000 | ORAL_TABLET | Freq: Every day | ORAL | 3 refills | Status: DC

## 2017-10-23 MED ORDER — LEVOTHYROXINE SODIUM 100 MCG PO TABS: 100.0000 ug | ORAL_TABLET | Freq: Every day | ORAL | 3 refills | Status: AC

## 2017-10-23 NOTE — Progress Notes (Signed)
Mallory Sanders  MRN: [id number] DOB: [birth year]  Subjective:  HPI   The patient is a 77 year old female who presents today for 6 month follow up of chronic health.  She was last seen on 04/24/17. At that time she had her labs checked and they were all stable.  She has no complaints today.  She feels well and her only request is to have her blood pressure and thyroid medications refilled.    Hypertension-She checks her blood pressure outside of the office and states that her readings are usually better.  She does admit to being under a little more stress over the last few weeks, things at home to be repaired and dealing with workers etc.  Thyroid disease-Stable on last lab, patient with no complaints.  Hyperchoesterol-level checked in October and it was stable.   GERD-Patient is on Lansoprazole and states her symptoms are very well controlled.  Patient Active Problem List   Diagnosis Date Noted  . Hypercholesterolemia 04/17/2016  . Encounter for screening colonoscopy 01/10/2015  . Abnormal finding on mammography 11/08/2014  . Allergic rhinitis 11/08/2014  . Family history of breast cancer 11/08/2014  . Acid reflux 11/08/2014  . Cardiac murmur 11/08/2014  . Benign hypertension 11/08/2014  . Adiposity 11/08/2014  . Arthritis, degenerative 11/08/2014  . Osteopenia 11/08/2014  . Thyroid nodule 11/08/2014  . Umbilical hernia 98/33/8250  . Hypothyroidism 10/02/2012    Past Medical History:  Diagnosis Date  . Cancer ([place])    skin cancer on left side of face  . Gallstone   . Heart murmur 1999  . Hemorrhoids   . Hepatitis A 1962  . Hypertension   . Hypothyroidism    s/p thyroidectomy  . S/P repair of ventral hernia 09/25/2003   Lower abdominal incarcerated ventral hernia repaired with Kugel patch    Social History   Socioeconomic History  . Marital status: Widowed    Spouse name: Not on file  . Number of children: 1  . Years of education: Master's  . Highest education  level: Not on file  Occupational History  . Occupation: Retired, Research officer, political party Needs  . Financial resource strain: Not on file  . Food insecurity:    Worry: Not on file    Inability: Not on file  . Transportation needs:    Medical: Not on file    Non-medical: Not on file  Tobacco Use  . Smoking status: Former Smoker    Packs/day: 0.50    Last attempt to quit: 07/02/1985    Years since quitting: 32.3  . Smokeless tobacco: Never Used  Substance and Sexual Activity  . Alcohol use: Yes    Comment: Social drinker /  monthly  . Drug use: No  . Sexual activity: Not on file  Lifestyle  . Physical activity:    Days per week: Not on file    Minutes per session: Not on file  . Stress: Not on file  Relationships  . Social connections:    Talks on phone: Not on file    Gets together: Not on file    Attends religious service: Not on file    Active member of club or organization: Not on file    Attends meetings of clubs or organizations: Not on file    Relationship status: Not on file  . Intimate partner violence:    Fear of current or ex partner: Not on file    Emotionally abused: Not on file    Physically  abused: Not on file    Forced sexual activity: Not on file  Other Topics Concern  . Not on file  Social History Narrative   Widowed   Does not get regular exercise    Outpatient Encounter Medications as of 10/23/2017  Medication Sig Note  . aspirin 81 MG tablet Take 81 mg by mouth daily.  11/08/2014: Received from: [facility]  . Calcium Carbonate-Vitamin D (CALCIUM-VITAMIN D) 500-200 MG-UNIT per tablet Take 1 tablet by mouth 3 (three) times daily.     . fexofenadine (ALLEGRA) 180 MG tablet Take 180 mg by mouth daily.    . lansoprazole (PREVACID) 30 MG capsule TAKE 1 CAPSULE BY MOUTH EVERY DAY   . levothyroxine (SYNTHROID, LEVOTHROID) 100 MCG tablet Take 100 mcg by mouth daily before breakfast.  04/08/2015: Received from: External Pharmacy  .  lisinopril-hydrochlorothiazide (PRINZIDE,ZESTORETIC) 10-12.5 MG tablet TAKE ONE TABLET BY MOUTH EVERY DAY   . meclizine (ANTIVERT) 25 MG tablet Take 25 mg by mouth 3 (three) times daily as needed for dizziness.   . Multiple Vitamins-Minerals (PRESERVISION AREDS 2 PO) Take 1 tablet by mouth 2 (two) times daily.   Marland Kitchen triamcinolone (NASACORT ALLERGY 24HR) 55 MCG/ACT AERO nasal inhaler Place 2 sprays into the nose daily.     No facility-administered encounter medications on file as of 10/23/2017.     Allergies  Allergen Reactions  . Propoxyphene N-Acetaminophen   . Streptomycin   . Tramadol Hcl   . Ultra Antioxidant Formula [Anti-Oxidant] Nausea Only    Review of Systems  Constitutional: Negative for fever.  HENT: Negative.   Eyes: Negative.   Respiratory: Negative for cough, shortness of breath and wheezing.   Cardiovascular: Negative for chest pain, palpitations, orthopnea, claudication and leg swelling.  Gastrointestinal: Negative.   Genitourinary: Negative.   Skin: Negative.   Neurological: Negative for dizziness and headaches.  Endo/Heme/Allergies: Negative.   Psychiatric/Behavioral: Negative.     Objective:  BP 140/82 (BP Location: Right Arm, Patient Position: Sitting, Cuff Size: Normal)   Pulse 68   Temp 97.7 F (36.5 C) (Oral)   Resp 16   Wt 188 lb (85.3 kg)   BMI 32.27 kg/m   Physical Exam  Constitutional: She is oriented to person, place, and time and well-developed, well-nourished, and in no distress.  HENT:  Head: Normocephalic and atraumatic.  Right Ear: External ear normal.  Left Ear: External ear normal.  Nose: Nose normal.  Eyes: Conjunctivae are normal.  Neck: No thyromegaly present.  Cardiovascular: Normal rate, regular rhythm and normal heart sounds.  Pulmonary/Chest: Effort normal and breath sounds normal.  Abdominal: Soft.  Musculoskeletal: She exhibits no edema.  Lymphadenopathy:    She has no cervical adenopathy.  Neurological: She is alert and  oriented to person, place, and time. Gait normal. GCS score is 15.  Skin: Skin is warm and dry.  Psychiatric: Mood, memory, affect and judgment normal.    Assessment and Plan :  1. Benign hypertension  - lisinopril-hydrochlorothiazide (PRINZIDE,ZESTORETIC) 10-12.5 MG tablet; Take 1 tablet by mouth daily.  Dispense: 90 tablet; Refill: 3  2. Hypothyroidism, unspecified type  - levothyroxine (SYNTHROID, LEVOTHROID) 100 MCG tablet; Take 1 tablet (100 mcg total) by mouth daily before breakfast.  Dispense: 90 tablet; Refill: 3  I have done the exam and reviewed the chart and it is accurate to the best of my knowledge. Development worker, community has been used and  any errors in dictation or transcription are unintentional. Miguel Aschoff M.D. [facility]  [place]

## 2017-11-12 ENCOUNTER — Other Ambulatory Visit: Payer: Self-pay | Admitting: Family Medicine

## 2017-11-12 DIAGNOSIS — Z1231 Encounter for screening mammogram for malignant neoplasm of breast: Secondary | ICD-10-CM

## 2017-12-02 ENCOUNTER — Ambulatory Visit
Admission: RE | Admit: 2017-12-02 | Discharge: 2017-12-02 | Disposition: A | Discharge status: Home / Self Care | Payer: Medicare Other | Source: Ambulatory Visit | Attending: Family Medicine | Admitting: Family Medicine

## 2017-12-02 DIAGNOSIS — Z1231 Encounter for screening mammogram for malignant neoplasm of breast: Secondary | ICD-10-CM | POA: Insufficient documentation

## 2018-04-28 ENCOUNTER — Encounter: Payer: Self-pay | Admitting: Family Medicine

## 2018-05-08 ENCOUNTER — Ambulatory Visit (INDEPENDENT_AMBULATORY_CARE_PROVIDER_SITE_OTHER): Payer: Medicare Other | Admitting: Family Medicine

## 2018-05-08 ENCOUNTER — Ambulatory Visit (INDEPENDENT_AMBULATORY_CARE_PROVIDER_SITE_OTHER): Payer: Medicare Other

## 2018-05-08 VITALS — BP 144/84 | HR 83 | Temp 98.8°F | Ht 64.0 in | Wt 190.0 lb

## 2018-05-08 DIAGNOSIS — K219 Gastro-esophageal reflux disease without esophagitis: Secondary | ICD-10-CM

## 2018-05-08 DIAGNOSIS — E039 Hypothyroidism, unspecified: Secondary | ICD-10-CM

## 2018-05-08 DIAGNOSIS — I10 Essential (primary) hypertension: Secondary | ICD-10-CM | POA: Diagnosis not present

## 2018-05-08 DIAGNOSIS — Z6832 Body mass index (BMI) 32.0-32.9, adult: Secondary | ICD-10-CM

## 2018-05-08 DIAGNOSIS — E7800 Pure hypercholesterolemia, unspecified: Secondary | ICD-10-CM | POA: Diagnosis not present

## 2018-05-08 DIAGNOSIS — E6609 Other obesity due to excess calories: Secondary | ICD-10-CM

## 2018-05-08 DIAGNOSIS — Z0000 Encounter for general adult medical examination without abnormal findings: Secondary | ICD-10-CM | POA: Diagnosis not present

## 2018-05-08 NOTE — Progress Notes (Signed)
Subjective:   Mallory Sanders is a 77 y.o. female who presents for Medicare Annual (Subsequent) preventive examination.  Review of Systems:  N/A  Cardiac Risk Factors include: advanced age (>46men, >83 women);dyslipidemia;hypertension;obesity (BMI >30kg/m2)     Objective:     Vitals: BP (!) 144/84 (BP Location: Right Arm)   Pulse 83   Temp 98.8 F (37.1 C) (Oral)   Ht 5\' 4"  (1.626 m)   Wt 190 lb (86.2 kg)   BMI 32.61 kg/m   Body mass index is 32.61 kg/m.  Advanced Directives 05/08/2018 04/24/2017 04/17/2016 04/08/2015  Does Patient Have a Medical Advance Directive? Yes Yes Yes Yes  Type of Advance Directive Living will;Healthcare Power of [place];Living will [place];Living will [place];Living will  Copy of [place] in Chart? No - copy requested No - copy requested - -    Tobacco Social History   Tobacco Use  Smoking Status Former Smoker  . Packs/day: 0.50  . Types: Cigarettes  . Last attempt to quit: 07/02/1985  . Years since quitting: 32.8  Smokeless Tobacco Never Used     Counseling given: Not Answered   Clinical Intake:  Pre-visit preparation completed: Yes  Pain : No/denies pain Pain Score: 0-No pain     Nutritional Status: BMI > 30  Obese Nutritional Risks: None Diabetes: No  How often do you need to have someone help you when you read instructions, pamphlets, or other written materials from your doctor or pharmacy?: 1 - Never  Interpreter Needed?: No  Information entered by :: [facility], LPN  Past Medical History:  Diagnosis Date  . Cancer ([place])    skin cancer on left side of face  . Gallstone   . Heart murmur 1999  . Hemorrhoids   . Hepatitis A 1962  . Hypertension   . Hypothyroidism    s/p thyroidectomy  . S/P repair of ventral hernia 09/25/2003   Lower abdominal incarcerated ventral hernia repaired with Kugel patch   Past Surgical History:    Procedure Laterality Date  . ABDOMINAL HYSTERECTOMY  1987  . APPENDECTOMY  1950  . COLONOSCOPY  2005   Dr. Bary Castilla  . COLONOSCOPY WITH PROPOFOL N/A 02/01/2015   Procedure: COLONOSCOPY WITH PROPOFOL;  Surgeon: Robert Bellow, MD;  Location: [facility] ENDOSCOPY;  Service: Endoscopy;  Laterality: N/A;  . HERNIA REPAIR  2005   Lower abdominal incarcerated ventral hernia repaired with Kugel patch  . HERNIA REPAIR  0/25/42   umbilical hernia repair  . 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 SKIN CANCER EXCISION  12/2014  . THYROIDECTOMY  08/2005  . TOTAL KNEE ARTHROPLASTY  04/13/2009   [facility], left knee   Family History  Problem Relation Age of Onset  . Cancer Mother   . Breast cancer Mother 41  . Leukemia Mother   . Stroke Father   . Breast cancer Maternal Aunt   . Colon cancer Maternal Uncle   . Breast cancer Cousin   . Breast cancer Cousin   . Coronary artery disease Neg Hx    Social History   Socioeconomic History  . Marital status: Widowed    Spouse name: Not on file  . Number of children: 1  . Years of education: Master's  . Highest education level: Master's degree (e.g., MA, MS, MEng, MEd, MSW, MBA)  Occupational History  . Occupation: Retired, Research officer, political party Needs  . Financial resource strain: Not  hard at all  . Food insecurity:    Worry: Never true    Inability: Never true  . Transportation needs:    Medical: No    Non-medical: No  Tobacco Use  . Smoking status: Former Smoker    Packs/day: 0.50    Types: Cigarettes    Last attempt to quit: 07/02/1985    Years since quitting: 32.8  . Smokeless tobacco: Never Used  Substance and Sexual Activity  . Alcohol use: Yes    Comment: Social drinker /  monthly  . Drug use: No  . Sexual activity: Not on file  Lifestyle  . Physical activity:    Days per week: 3 days    Minutes per session: 20 min  . Stress: Only a little  Relationships  . Social connections:    Talks on phone: Patient refused    Gets together:  Patient refused    Attends religious service: Patient refused    Active member of club or organization: Patient refused    Attends meetings of clubs or organizations: Patient refused    Relationship status: Patient refused  Other Topics Concern  . Not on file  Social History Narrative   Widowed   Does not get regular exercise    Outpatient Encounter Medications as of 05/08/2018  Medication Sig  . aspirin 81 MG tablet Take 81 mg by mouth daily.   . Calcium Carbonate-Vitamin D (CALCIUM-VITAMIN D) 500-200 MG-UNIT per tablet Take 1 tablet by mouth 3 (three) times daily.    Marland Kitchen dimenhyDRINATE (DRAMAMINE) 50 MG tablet Take 50 mg by mouth as needed.  . fexofenadine (ALLEGRA) 180 MG tablet Take 180 mg by mouth daily.   . Ibuprofen (ADVIL) 200 MG CAPS Take 2 capsules by mouth as needed.  . lansoprazole (PREVACID) 30 MG capsule TAKE 1 CAPSULE BY MOUTH EVERY DAY  . levothyroxine (SYNTHROID, LEVOTHROID) 100 MCG tablet Take 1 tablet (100 mcg total) by mouth daily before breakfast.  . lisinopril-hydrochlorothiazide (PRINZIDE,ZESTORETIC) 10-12.5 MG tablet Take 1 tablet by mouth daily.  . meclizine (ANTIVERT) 25 MG tablet Take 25 mg by mouth 3 (three) times daily as needed for dizziness.  . Multiple Vitamins-Minerals (PRESERVISION AREDS 2 PO) Take 1 tablet by mouth 2 (two) times daily.  . Nutritional Supplements (JUICE PLUS FIBRE PO) Take by mouth daily.  Marland Kitchen triamcinolone (NASACORT ALLERGY 24HR) 55 MCG/ACT AERO nasal inhaler Place 2 sprays into the nose daily.    No facility-administered encounter medications on file as of 05/08/2018.     Activities of Daily Living In your present state of health, do you have any difficulty performing the following activities: 05/08/2018  Hearing? N  Vision? N  Comment Wears eyeglasses.   Difficulty concentrating or making decisions? N  Walking or climbing stairs? N  Dressing or bathing? N  Doing errands, shopping? N  Preparing Food and eating ? N  Using the  Toilet? N  In the past six months, have you accidently leaked urine? N  Comment Has a prolapsed bladder.   Do you have problems with loss of bowel control? N  Managing your Medications? N  Managing your Finances? N  Housekeeping or managing your Housekeeping? N  Some recent data might be hidden    Patient Care Team: Jerrol Banana., MD as PCP - General (Family Medicine) Bary Castilla, Forest Gleason, MD as Consulting Physician (General Surgery) Eulogio Bear, MD as Consulting Physician (Ophthalmology) Margaretha Sheffield, MD as Consulting Physician (Otolaryngology) Oneta Rack, MD as Consulting  Physician (Dermatology)    Assessment:   This is a routine wellness examination for Kymiah.  Exercise Activities and Dietary recommendations Current Exercise Habits: Home exercise routine, Type of exercise: stretching, Time (Minutes): 20, Frequency (Times/Week): 3, Weekly Exercise (Minutes/Week): 60, Intensity: Mild, Exercise limited by: None identified  Goals    . Cut out extra servings     Recommend to eat 3 balanced meals a day with two healthy snack in between. Recommend to cut out extra snacks.     Marland Kitchen DIET - INCREASE WATER INTAKE     Recommend to drink at least 6-8 8oz glasses of water per day.          Fall Risk Fall Risk  05/08/2018 04/24/2017 04/17/2016 04/08/2015  Falls in the past year? 0 No No No   FALL RISK PREVENTION PERTAINING TO THE HOME:  Any stairs in or around the home WITH handrails? Yes  Home free of loose throw rugs in walkways, pet beds, electrical cords, etc? Yes  Adequate lighting in your home to reduce risk of falls? Yes   ASSISTIVE DEVICES UTILIZED TO PREVENT FALLS:  Life alert? No  Use of a cane, walker or w/c? No  Grab bars in the bathroom? Yes  Shower chair or bench in shower? No  Elevated toilet seat or a handicapped toilet? No    TIMED UP AND GO:  Was the test performed? No .    Depression Screen PHQ 2/9 Scores 05/08/2018 05/08/2018  04/24/2017 04/24/2017  PHQ - 2 Score 0 0 0 0  PHQ- 9 Score 0 - 0 -     Cognitive Function: Declined today.         Immunization History  Administered Date(s) Administered  . Influenza, High Dose Seasonal PF 04/08/2015, 04/17/2016, 04/01/2017, 03/25/2018  . Pneumococcal Conjugate-13 01/20/2014  . Pneumococcal Polysaccharide-23 04/17/2016  . Td 09/21/2004  . Tdap 02/27/2011  . Zoster 04/08/2006  . Zoster Recombinat (Shingrix) 07/08/2017, 09/09/2017    Qualifies for Shingles Vaccine? Completed series.  Tdap: Up to date  Flu Vaccine: Up to date  Pneumococcal Vaccine: Up to date   Screening Tests Health Maintenance  Topic Date Due  . DEXA SCAN  04/19/2020  . TETANUS/TDAP  02/26/2021  . INFLUENZA VACCINE  Completed  . PNA vac Low Risk Adult  Completed   Cancer Screenings:  Colorectal Screening: No longer required.   Mammogram: No longer required.   Bone Density: Completed 04/20/15. Results reflect OSTEOPENIA. Repeat every 5 years.   Lung Cancer Screening: (Low Dose CT Chest recommended if Age 40-80 years, 30 pack-year currently smoking OR have quit w/in 15years.) does not qualify.    Additional Screening:  Vision Screening: Recommended annual ophthalmology exams for early detection of glaucoma and other disorders of the eye.  Dental Screening: Recommended annual dental exams for proper oral hygiene  Community Resource Referral:  CRR required this visit?  No       Plan:  I have personally reviewed and addressed the Medicare Annual Wellness questionnaire and have noted the following in the patient's chart:  A. Medical and social history B. Use of alcohol, tobacco or illicit drugs  C. Current medications and supplements D. Functional ability and status E.  Nutritional status F.  Physical activity G. Advance directives H. List of other physicians I.  Hospitalizations, surgeries, and ER visits in previous 12 months J.  [place] such as hearing  and vision if needed, cognitive and depression L. Referrals and appointments - none  In addition, I have reviewed and discussed with patient certain preventive protocols, quality metrics, and best practice recommendations. A written personalized care plan for preventive services as well as general preventive health recommendations were provided to patient.  See attached scanned questionnaire for additional information.   Signed,  Fabio Neighbors, LPN Nurse Health Advisor   Nurse Recommendations: None.

## 2018-05-08 NOTE — Progress Notes (Signed)
[name], Female    DOB: Dec 28, 1940, 77 y.o.   MRN: [id number] Visit Date: 05/08/2018  Today's Provider: [name], MD   Chief Complaint  Patient presents with  . Annual Exam   Subjective:  Mallory Sanders is a 77 y.o. female who presents today for health maintenance and complete physical. She feels well. She reports exercising 4 times weekly. She reports she is sleeping well.  02/01/15 Colonoscopy, [place]-repeat 10 years 12/02/17 Mammogram-negative 04/20/15 BMD-Osteoporosis 06/14/09 Pap-WNL   Review of Systems  Constitutional: Negative.   HENT: Negative.   Eyes: Negative.   Respiratory: Negative.   Cardiovascular: Negative.   Gastrointestinal: Negative.   Endocrine: Negative.   Genitourinary: Negative.   Musculoskeletal: Negative.   Skin: Negative.   Allergic/Immunologic: Positive for environmental allergies.  Neurological: Negative.   Hematological: Negative.   Psychiatric/Behavioral: Negative.     Social History   Socioeconomic History  . Marital status: Widowed    Spouse name: Not on file  . Number of children: 1  . Years of education: Master's  . Highest education level: Master's degree (e.g., MA, MS, MEng, MEd, MSW, MBA)  Occupational History  . Occupation: Retired, Research officer, political party Needs  . Financial resource strain: Not hard at all  . Food insecurity:    Worry: Never true    Inability: Never true  . Transportation needs:    Medical: No    Non-medical: No  Tobacco Use  . Smoking status: Former Smoker    Packs/day: 0.50    Types: Cigarettes    Last attempt to quit: 07/02/1985    Years since quitting: 32.8  . Smokeless tobacco: Never Used  Substance and Sexual Activity  . Alcohol use: Yes    Comment: Social drinker /  monthly  . Drug use: No  . Sexual activity: Not on file  Lifestyle  . Physical activity:    Days per week: 3 days    Minutes per session: 20 min  . Stress: Only a little  Relationships  . Social connections:   Talks on phone: Patient refused    Gets together: Patient refused    Attends religious service: Patient refused    Active member of club or organization: Patient refused    Attends meetings of clubs or organizations: Patient refused    Relationship status: Patient refused  . Intimate partner violence:    Fear of current or ex partner: Patient refused    Emotionally abused: Patient refused    Physically abused: Patient refused    Forced sexual activity: Patient refused  Other Topics Concern  . Not on file  Social History Narrative   Widowed   Does not get regular exercise    Patient Active Problem List   Diagnosis Date Noted  . Hypercholesterolemia 04/17/2016  . Encounter for screening colonoscopy 01/10/2015  . Abnormal finding on mammography 11/08/2014  . Allergic rhinitis 11/08/2014  . Family history of breast cancer 11/08/2014  . Acid reflux 11/08/2014  . Cardiac murmur 11/08/2014  . Benign hypertension 11/08/2014  . Adiposity 11/08/2014  . Arthritis, degenerative 11/08/2014  . Osteopenia 11/08/2014  . Thyroid nodule 11/08/2014  . Umbilical hernia 78/29/5621  . Hypothyroidism 10/02/2012    Past Surgical History:  Procedure Laterality Date  . ABDOMINAL HYSTERECTOMY  1987  . APPENDECTOMY  1950  . COLONOSCOPY  2005   Dr. Bary Castilla  . COLONOSCOPY WITH PROPOFOL N/A 02/01/2015   Procedure: COLONOSCOPY WITH PROPOFOL;  Surgeon: Robert Bellow, MD;  Location:  [place] ENDOSCOPY;  Service: Endoscopy;  Laterality: N/A;  . HERNIA REPAIR  2005   Lower abdominal incarcerated ventral hernia repaired with Kugel patch  . HERNIA REPAIR  7/84/69   umbilical hernia repair  . 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 SKIN CANCER EXCISION  12/2014  . THYROIDECTOMY  08/2005  . TOTAL KNEE ARTHROPLASTY  04/13/2009   [facility], left knee    Her family history includes Breast cancer in her cousin, cousin, and maternal aunt; Breast cancer (age of onset: 23) in her mother; Cancer in her mother;  Colon cancer in her maternal uncle; Leukemia in her mother; Stroke in her father.     Outpatient Encounter Medications as of 05/08/2018  Medication Sig Note  . aspirin 81 MG tablet Take 81 mg by mouth daily.  11/08/2014: Received from: [facility]  . Calcium Carbonate-Vitamin D (CALCIUM-VITAMIN D) 500-200 MG-UNIT per tablet Take 1 tablet by mouth 3 (three) times daily.     Marland Kitchen dimenhyDRINATE (DRAMAMINE) 50 MG tablet Take 50 mg by mouth as needed.   . fexofenadine (ALLEGRA) 180 MG tablet Take 180 mg by mouth daily.    . Ibuprofen (ADVIL) 200 MG CAPS Take 2 capsules by mouth as needed.   . lansoprazole (PREVACID) 30 MG capsule TAKE 1 CAPSULE BY MOUTH EVERY DAY   . levothyroxine (SYNTHROID, LEVOTHROID) 100 MCG tablet Take 1 tablet (100 mcg total) by mouth daily before breakfast.   . lisinopril-hydrochlorothiazide (PRINZIDE,ZESTORETIC) 10-12.5 MG tablet Take 1 tablet by mouth daily.   . meclizine (ANTIVERT) 25 MG tablet Take 25 mg by mouth 3 (three) times daily as needed for dizziness.   . Multiple Vitamins-Minerals (PRESERVISION AREDS 2 PO) Take 1 tablet by mouth 2 (two) times daily.   . Nutritional Supplements (JUICE PLUS FIBRE PO) Take by mouth daily.   Marland Kitchen triamcinolone (NASACORT ALLERGY 24HR) 55 MCG/ACT AERO nasal inhaler Place 2 sprays into the nose daily.     No facility-administered encounter medications on file as of 05/08/2018.     Patient Care Team: Jerrol Banana., MD as PCP - General (Family Medicine) Bary Castilla, Forest Gleason, MD as Consulting Physician (General Surgery) Eulogio Bear, MD as Consulting Physician (Ophthalmology) Margaretha Sheffield, MD as Consulting Physician (Otolaryngology) Oneta Rack, MD as Consulting Physician (Dermatology)      Objective:   Vitals:  Vitals:   05/08/18 0932  BP: (!) 144/84  Pulse: 83  Temp: 98.8 F (37.1 C)  TempSrc: Oral  Weight: 190 lb (86.2 kg)  Height: 5\' 4"  (1.626 m)    Physical Exam  Constitutional: She is  oriented to person, place, and time. She appears well-developed and well-nourished.  HENT:  Head: Normocephalic and atraumatic.  Right Ear: External ear normal.  Left Ear: External ear normal.  Nose: Nose normal.  Mouth/Throat: Oropharynx is clear and moist.  Eyes: Pupils are equal, round, and reactive to light. Conjunctivae and EOM are normal.  Neck: Normal range of motion. Neck supple.  Cardiovascular: Normal rate, regular rhythm, normal heart sounds and intact distal pulses.  Pulmonary/Chest: Effort normal and breath sounds normal.  Abdominal: Soft. Bowel sounds are normal.  Musculoskeletal: Normal range of motion.  Neurological: She is alert and oriented to person, place, and time.  Skin: Skin is warm and dry.  Psychiatric: She has a normal mood and affect. Her behavior is normal. Judgment and thought content normal.     Depression Screen PHQ 2/9 Scores 05/08/2018 05/08/2018 04/24/2017 04/24/2017  PHQ -  2 Score 0 0 0 0  PHQ- 9 Score 0 - 0 -      Assessment & Plan:     Routine Health Maintenance and Physical Exam  Exercise Activities and Dietary recommendations Goals    . Cut out extra servings     Recommend to eat 3 balanced meals a day with two healthy snack in between. Recommend to cut out extra snacks.     Marland Kitchen DIET - INCREASE WATER INTAKE     Recommend to drink at least 6-8 8oz glasses of water per day.          Immunization History  Administered Date(s) Administered  . Influenza, High Dose Seasonal PF 04/08/2015, 04/17/2016, 04/01/2017, 03/25/2018  . Pneumococcal Conjugate-13 01/20/2014  . Pneumococcal Polysaccharide-23 04/17/2016  . Td 09/21/2004  . Tdap 02/27/2011  . Zoster 04/08/2006  . Zoster Recombinat (Shingrix) 07/08/2017, 09/09/2017    Health Maintenance  Topic Date Due  . DEXA SCAN  04/19/2020  . TETANUS/TDAP  02/26/2021  . INFLUENZA VACCINE  Completed  . PNA vac Low Risk Adult  Completed     Discussed health benefits of physical  activity, and encouraged her to engage in regular exercise appropriate for her age and condition.     1. Annual physical exam   2. Hypothyroidism, unspecified type Per Dr Lyda Perone.  3. Benign hypertension  - CBC with Differential/Platelet - Comprehensive metabolic panel  4. Hypercholesterolemia  - Lipid Panel With LDL/HDL Ratio  5. Gastroesophageal reflux disease without esophagitis Try Lansoparazole  6. Class 1 obesity due to excess calories with serious comorbidity and body mass index (BMI) of 32.0 to 32.9 in adult With GERD/HTN/OA Lifestyle discussed with pt.  HPI, Exam and A&P Transcribed under the direction and in the presence of Miguel Aschoff, Brooke Bonito., MD. Electronically Signed: Althea Charon, RMA I have done the exam and reviewed the chart and it is accurate to the best of my knowledge. Development worker, community has been used and  any errors in dictation or transcription are unintentional. Miguel Aschoff M.D. [facility]

## 2018-05-08 NOTE — Patient Instructions (Addendum)
Mallory Sanders , Thank you for taking time to come for your Medicare Wellness Visit. I appreciate your ongoing commitment to your health goals. Please review the following plan we discussed and let me know if I can assist you in the future.   Screening recommendations/referrals: Colonoscopy: No longer required.  Mammogram: No longer required.  Bone Density: Up to date, due 04/2020 Recommended yearly ophthalmology/optometry visit for glaucoma screening and checkup Recommended yearly dental visit for hygiene and checkup  Vaccinations: Influenza vaccine: Up to date, due fall 2020 Pneumococcal vaccine: Completed series Tdap vaccine: Up to date, due 01/2021 Shingles vaccine: Completed Shingrix series.    Advanced directives: Please bring a copy of your POA (Power of Attorney) and/or Living Will to your next appointment.   Conditions/risks identified: Continue monitoring diet and work to increase water intake to 6-8 8 oz glasses a day.   Next appointment: 9:40 AM today with Dr Rosanna Randy.    Preventive Care 79 Years and Older, Female Preventive care refers to lifestyle choices and visits with your health care provider that can promote health and wellness. What does preventive care include?  A yearly physical exam. This is also called an annual well check.  Dental exams once or twice a year.  Routine eye exams. Ask your health care provider how often you should have your eyes checked.  Personal lifestyle choices, including:  Daily care of your teeth and gums.  Regular physical activity.  Eating a healthy diet.  Avoiding tobacco and drug use.  Limiting alcohol use.  Practicing safe sex.  Taking low-dose aspirin every day.  Taking vitamin and mineral supplements as recommended by your health care provider. What happens during an annual well check? The services and screenings done by your health care provider during your annual well check will depend on your age, overall health,  lifestyle risk factors, and family history of disease. Counseling  Your health care provider may ask you questions about your:  Alcohol use.  Tobacco use.  Drug use.  Emotional well-being.  Home and relationship well-being.  Sexual activity.  Eating habits.  History of falls.  Memory and ability to understand (cognition).  Work and work Statistician.  Reproductive health. Screening  You may have the following tests or measurements:  Height, weight, and BMI.  Blood pressure.  Lipid and cholesterol levels. These may be checked every 5 years, or more frequently if you are over 39 years old.  Skin check.  Lung cancer screening. You may have this screening every year starting at age 25 if you have a 30-pack-year history of smoking and currently smoke or have quit within the past 15 years.  Fecal occult blood test (FOBT) of the stool. You may have this test every year starting at age 28.  Flexible sigmoidoscopy or colonoscopy. You may have a sigmoidoscopy every 5 years or a colonoscopy every 10 years starting at age 11.  Hepatitis C blood test.  Hepatitis B blood test.  Sexually transmitted disease (STD) testing.  Diabetes screening. This is done by checking your blood sugar (glucose) after you have not eaten for a while (fasting). You may have this done every 1-3 years.  Bone density scan. This is done to screen for osteoporosis. You may have this done starting at age 48.  Mammogram. This may be done every 1-2 years. Talk to your health care provider about how often you should have regular mammograms. Talk with your health care provider about your test results, treatment options, and if  necessary, the need for more tests. Vaccines  Your health care provider may recommend certain vaccines, such as:  Influenza vaccine. This is recommended every year.  Tetanus, diphtheria, and acellular pertussis (Tdap, Td) vaccine. You may need a Td booster every 10 years.  Zoster  vaccine. You may need this after age 24.  Pneumococcal 13-valent conjugate (PCV13) vaccine. One dose is recommended after age 27.  Pneumococcal polysaccharide (PPSV23) vaccine. One dose is recommended after age 23. Talk to your health care provider about which screenings and vaccines you need and how often you need them. This information is not intended to replace advice given to you by your health care provider. Make sure you discuss any questions you have with your health care provider. Document Released: 07/15/2015 Document Revised: 03/07/2016 Document Reviewed: 04/19/2015 Elsevier Interactive Patient Education  2017 [place] Prevention in the Home Falls can cause injuries. They can happen to people of all ages. There are many things you can do to make your home safe and to help prevent falls. What can I do on the outside of my home?  Regularly fix the edges of walkways and driveways and fix any cracks.  Remove anything that might make you trip as you walk through a door, such as a raised step or threshold.  Trim any bushes or trees on the path to your home.  Use bright outdoor lighting.  Clear any walking paths of anything that might make someone trip, such as rocks or tools.  Regularly check to see if handrails are loose or broken. Make sure that both sides of any steps have handrails.  Any raised decks and porches should have guardrails on the edges.  Have any leaves, snow, or ice cleared regularly.  Use sand or salt on walking paths during winter.  Clean up any spills in your garage right away. This includes oil or grease spills. What can I do in the bathroom?  Use night lights.  Install grab bars by the toilet and in the tub and shower. Do not use towel bars as grab bars.  Use non-skid mats or decals in the tub or shower.  If you need to sit down in the shower, use a plastic, non-slip stool.  Keep the floor dry. Clean up any water that spills on the  floor as soon as it happens.  Remove soap buildup in the tub or shower regularly.  Attach bath mats securely with double-sided non-slip rug tape.  Do not have throw rugs and other things on the floor that can make you trip. What can I do in the bedroom?  Use night lights.  Make sure that you have a light by your bed that is easy to reach.  Do not use any sheets or blankets that are too big for your bed. They should not hang down onto the floor.  Have a firm chair that has side arms. You can use this for support while you get dressed.  Do not have throw rugs and other things on the floor that can make you trip. What can I do in the kitchen?  Clean up any spills right away.  Avoid walking on wet floors.  Keep items that you use a lot in easy-to-reach places.  If you need to reach something above you, use a strong step stool that has a grab bar.  Keep electrical cords out of the way.  Do not use floor polish or wax that makes floors slippery. If you must  use wax, use non-skid floor wax.  Do not have throw rugs and other things on the floor that can make you trip. What can I do with my stairs?  Do not leave any items on the stairs.  Make sure that there are handrails on both sides of the stairs and use them. Fix handrails that are broken or loose. Make sure that handrails are as long as the stairways.  Check any carpeting to make sure that it is firmly attached to the stairs. Fix any carpet that is loose or worn.  Avoid having throw rugs at the top or bottom of the stairs. If you do have throw rugs, attach them to the floor with carpet tape.  Make sure that you have a light switch at the top of the stairs and the bottom of the stairs. If you do not have them, ask someone to add them for you. What else can I do to help prevent falls?  Wear shoes that:  Do not have high heels.  Have rubber bottoms.  Are comfortable and fit you well.  Are closed at the toe. Do not wear  sandals.  If you use a stepladder:  Make sure that it is fully opened. Do not climb a closed stepladder.  Make sure that both sides of the stepladder are locked into place.  Ask someone to hold it for you, if possible.  Clearly mark and make sure that you can see:  Any grab bars or handrails.  First and last steps.  Where the edge of each step is.  Use tools that help you move around (mobility aids) if they are needed. These include:  Canes.  Walkers.  Scooters.  Crutches.  Turn on the lights when you go into a dark area. Replace any light bulbs as soon as they burn out.  Set up your furniture so you have a clear path. Avoid moving your furniture around.  If any of your floors are uneven, fix them.  If there are any pets around you, be aware of where they are.  Review your medicines with your doctor. Some medicines can make you feel dizzy. This can increase your chance of falling. Ask your doctor what other things that you can do to help prevent falls. This information is not intended to replace advice given to you by your health care provider. Make sure you discuss any questions you have with your health care provider. Document Released: 04/14/2009 Document Revised: 11/24/2015 Document Reviewed: 07/23/2014 Elsevier Interactive Patient Education  2017 [employer].

## 2018-05-09 LAB — CBC WITH DIFFERENTIAL/PLATELET
Basophils Absolute: 0.1 10*3/uL (ref 0.0–0.2)
Basos: 1 %
EOS (ABSOLUTE): 0.1 10*3/uL (ref 0.0–0.4)
EOS: 2 %
Hematocrit: 39.9 % (ref 34.0–46.6)
Hemoglobin: 13.1 g/dL (ref 11.1–15.9)
IMMATURE GRANULOCYTES: 0 %
Immature Grans (Abs): 0 10*3/uL (ref 0.0–0.1)
Lymphocytes Absolute: 1.3 10*3/uL (ref 0.7–3.1)
Lymphs: 25 %
MCH: 31.9 pg (ref 26.6–33.0)
MCHC: 32.8 g/dL (ref 31.5–35.7)
MCV: 97 fL (ref 79–97)
MONOCYTES: 12 %
Monocytes Absolute: 0.6 10*3/uL (ref 0.1–0.9)
NEUTROS PCT: 60 %
Neutrophils Absolute: 3.2 10*3/uL (ref 1.4–7.0)
PLATELETS: 277 10*3/uL (ref 150–450)
RBC: 4.11 x10E6/uL (ref 3.77–5.28)
RDW: 11.9 % — AB (ref 12.3–15.4)
WBC: 5.3 10*3/uL (ref 3.4–10.8)

## 2018-05-09 LAB — COMPREHENSIVE METABOLIC PANEL
A/G RATIO: 2.5 — AB (ref 1.2–2.2)
ALT: 8 IU/L (ref 0–32)
AST: 13 IU/L (ref 0–40)
Albumin: 4.7 g/dL (ref 3.5–4.8)
Alkaline Phosphatase: 86 IU/L (ref 39–117)
BUN/Creatinine Ratio: 16 (ref 12–28)
BUN: 16 mg/dL (ref 8–27)
Bilirubin Total: 0.4 mg/dL (ref 0.0–1.2)
CALCIUM: 9.6 mg/dL (ref 8.7–10.3)
CO2: 22 mmol/L (ref 20–29)
CREATININE: 0.98 mg/dL (ref 0.57–1.00)
Chloride: 92 mmol/L — ABNORMAL LOW (ref 96–106)
GFR calc Af Amer: 63 mL/min/{1.73_m2} (ref >59)
GFR, EST NON AFRICAN AMERICAN: 55 mL/min/{1.73_m2} — AB (ref >59)
Globulin, Total: 1.9 g/dL (ref 1.5–4.5)
Glucose: 85 mg/dL (ref 65–99)
POTASSIUM: 4.7 mmol/L (ref 3.5–5.2)
Sodium: 128 mmol/L — ABNORMAL LOW (ref 134–144)
Total Protein: 6.6 g/dL (ref 6.0–8.5)

## 2018-05-09 LAB — LIPID PANEL WITH LDL/HDL RATIO
Cholesterol, Total: 207 mg/dL — ABNORMAL HIGH (ref 100–199)
HDL: 87 mg/dL (ref >39)
LDL Calculated: 110 mg/dL — ABNORMAL HIGH (ref 0–99)
LDL/HDL RATIO: 1.3 ratio (ref 0.0–3.2)
TRIGLYCERIDES: 48 mg/dL (ref 0–149)
VLDL Cholesterol Cal: 10 mg/dL (ref 5–40)

## 2018-06-05 ENCOUNTER — Encounter: Payer: Self-pay | Admitting: Family Medicine

## 2018-06-05 ENCOUNTER — Other Ambulatory Visit: Payer: Self-pay

## 2018-06-05 ENCOUNTER — Ambulatory Visit: Payer: Self-pay | Admitting: Family Medicine

## 2018-06-05 ENCOUNTER — Ambulatory Visit: Payer: Medicare Other | Admitting: Family Medicine

## 2018-06-05 VITALS — BP 130/62 | HR 84 | Temp 99.3°F | Ht 64.0 in | Wt 190.8 lb

## 2018-06-05 DIAGNOSIS — J069 Acute upper respiratory infection, unspecified: Secondary | ICD-10-CM | POA: Diagnosis not present

## 2018-06-05 NOTE — Progress Notes (Signed)
  Subjective:     Patient ID: Mallory Sanders, female   DOB: 10/04/1940, 77 y.o.   MRN: [id number] Chief Complaint  Patient presents with  . Cough    started up on Tues night Wed morning 06/03/18, 06/04/18  . Sore Throat    started first 06/02/18  . Fever    99.5 this morning 06/05/18   HPI Reports cold symptoms with mild sinus congestion  Review of Systems     Objective:   Physical Exam  Constitutional: She appears well-developed and well-nourished. She does not appear ill.  Ears: T.M's intact without inflammationr Throat: no tonsillar enlargement or exudate Neck: no cervical adenopathy Lungs: Transient inspiratory wheezes which clear with cough     Assessment:    1. URI, acute    Plan:    Discussed use of Mucinex and Delsym. May call if not improving early next week.

## 2018-06-05 NOTE — Patient Instructions (Signed)
Start Mucinex every 12 hours and the 12 hour cough suppressant. May add Sudafed if your sinus congestion worsens. Call early next week if not improving.

## 2018-06-16 ENCOUNTER — Telehealth: Payer: Self-pay | Admitting: Family Medicine

## 2018-06-16 ENCOUNTER — Other Ambulatory Visit: Payer: Self-pay | Admitting: Family Medicine

## 2018-06-16 MED ORDER — AMOXICILLIN-POT CLAVULANATE 875-125 MG PO TABS: 1.0000 | ORAL_TABLET | Freq: Two times a day (BID) | ORAL | 0 refills | Status: DC

## 2018-06-16 NOTE — Telephone Encounter (Signed)
Patient is very congested in chest, coughing every breath on phone call.  Blowing yellow out of her nose.   She is not getting any better since her visit on 06/05/18.  Uses [facility].  Please let patient know if she needs appt or calling in something.

## 2018-06-16 NOTE — Telephone Encounter (Signed)
Patient advised.KW 

## 2018-06-16 NOTE — Telephone Encounter (Signed)
Please review chart and advise. KW 

## 2018-06-16 NOTE — Telephone Encounter (Signed)
I have sent in  Augmentin to cover her for a sinus infection

## 2018-07-28 ENCOUNTER — Other Ambulatory Visit: Payer: Self-pay | Admitting: Family Medicine

## 2018-07-28 DIAGNOSIS — K219 Gastro-esophageal reflux disease without esophagitis: Secondary | ICD-10-CM

## 2018-10-23 ENCOUNTER — Ambulatory Visit: Payer: Self-pay | Admitting: Family Medicine

## 2018-12-03 ENCOUNTER — Other Ambulatory Visit: Payer: Self-pay | Admitting: Family Medicine

## 2018-12-03 DIAGNOSIS — Z1231 Encounter for screening mammogram for malignant neoplasm of breast: Secondary | ICD-10-CM

## 2018-12-16 ENCOUNTER — Other Ambulatory Visit: Payer: Self-pay | Admitting: Family Medicine

## 2018-12-16 DIAGNOSIS — I10 Essential (primary) hypertension: Secondary | ICD-10-CM

## 2018-12-25 ENCOUNTER — Ambulatory Visit: Payer: Self-pay | Admitting: Family Medicine

## 2019-01-16 ENCOUNTER — Other Ambulatory Visit: Payer: Self-pay

## 2019-01-16 ENCOUNTER — Ambulatory Visit
Admission: RE | Admit: 2019-01-16 | Discharge: 2019-01-16 | Disposition: A | Discharge status: Home / Self Care | Payer: Medicare Other | Source: Ambulatory Visit | Attending: Family Medicine | Admitting: Family Medicine

## 2019-01-16 DIAGNOSIS — Z1231 Encounter for screening mammogram for malignant neoplasm of breast: Secondary | ICD-10-CM | POA: Insufficient documentation

## 2019-02-02 ENCOUNTER — Ambulatory Visit: Payer: Self-pay | Admitting: Family Medicine

## 2019-02-25 ENCOUNTER — Ambulatory Visit (INDEPENDENT_AMBULATORY_CARE_PROVIDER_SITE_OTHER): Payer: Medicare Other | Admitting: Family Medicine

## 2019-02-25 ENCOUNTER — Other Ambulatory Visit: Payer: Self-pay

## 2019-02-25 ENCOUNTER — Encounter: Payer: Self-pay | Admitting: Family Medicine

## 2019-02-25 VITALS — BP 122/68 | HR 80 | Temp 98.9°F | Resp 16 | Ht 64.0 in | Wt 196.0 lb

## 2019-02-25 DIAGNOSIS — E6609 Other obesity due to excess calories: Secondary | ICD-10-CM | POA: Diagnosis not present

## 2019-02-25 DIAGNOSIS — Z6832 Body mass index (BMI) 32.0-32.9, adult: Secondary | ICD-10-CM

## 2019-02-25 DIAGNOSIS — K219 Gastro-esophageal reflux disease without esophagitis: Secondary | ICD-10-CM

## 2019-02-25 DIAGNOSIS — I10 Essential (primary) hypertension: Secondary | ICD-10-CM

## 2019-02-25 DIAGNOSIS — E039 Hypothyroidism, unspecified: Secondary | ICD-10-CM | POA: Diagnosis not present

## 2019-02-25 NOTE — Progress Notes (Signed)
[name] Female    DOB: 18-Nov-1940   78 y.o.   MRN: [id number] Visit Date: 02/25/2019  Today's Provider: [name], MD   Chief Complaint  Patient presents with  . Hypertension  . Hypothyroidism   Subjective:   HPI  Hypertension, follow-up:  BP Readings from Last 3 Encounters:  02/25/19 122/68  06/05/18 130/62  05/08/18 (!) 144/84    She was last seen for hypertension 6 months ago.  BP at that visit was 130/62. Management since that visit includes no changes. She reports good compliance with treatment. She is not having side effects.  She is not exercising. She is adherent to low salt diet.   Outside blood pressures are not being checked. She is experiencing none.  Patient denies exertional chest pressure/discomfort, lower extremity edema and palpitations.    Weight trend: stable Wt Readings from Last 3 Encounters:  02/25/19 196 lb (88.9 kg)  06/05/18 190 lb 12.8 oz (86.5 kg)  05/08/18 190 lb (86.2 kg)    Current diet: well balanced  Hypothyroidism, follow up:  Patient was last seen in the office 6 months ago. No medications were changed since last visit. She is currently taking levothyroxine 15mcg daily. She reports good compliance and good symptom control.    Allergies  Allergen Reactions  . Propoxyphene N-Acetaminophen   . Streptomycin   . Tramadol Hcl   . Ultra Antioxidant Formula [Anti-Oxidant] Nausea Only     Current Outpatient Medications:  .  aspirin 81 MG tablet, Take 81 mg by mouth daily. , Disp: , Rfl:  .  Calcium Carbonate-Vitamin D (CALCIUM-VITAMIN D) 500-200 MG-UNIT per tablet, Take 1 tablet by mouth 3 (three) times daily.  , Disp: , Rfl:  .  dimenhyDRINATE (DRAMAMINE) 50 MG tablet, Take 50 mg by mouth as needed., Disp: , Rfl:  .  fexofenadine (ALLEGRA) 180 MG tablet, Take 180 mg by mouth daily. , Disp: , Rfl:  .  Ibuprofen (ADVIL) 200 MG CAPS, Take 2 capsules by mouth as needed., Disp: , Rfl:  .  lansoprazole  (PREVACID) 30 MG capsule, TAKE 1 CAPSULE BY MOUTH EVERY DAY, Disp: 90 capsule, Rfl: 3 .  levothyroxine (SYNTHROID, LEVOTHROID) 100 MCG tablet, Take 1 tablet (100 mcg total) by mouth daily before breakfast., Disp: 90 tablet, Rfl: 3 .  lisinopril-hydrochlorothiazide (ZESTORETIC) 10-12.5 MG tablet, TAKE ONE TABLET BY MOUTH EVERY DAY, Disp: 90 tablet, Rfl: 3 .  meclizine (ANTIVERT) 25 MG tablet, Take 25 mg by mouth 3 (three) times daily as needed for dizziness., Disp: , Rfl:  .  Multiple Vitamins-Minerals (PRESERVISION AREDS 2 PO), Take 1 tablet by mouth 2 (two) times daily., Disp: , Rfl:  .  Nutritional Supplements (JUICE PLUS FIBRE PO), Take by mouth daily., Disp: , Rfl:  .  triamcinolone (NASACORT ALLERGY 24HR) 55 MCG/ACT AERO nasal inhaler, Place 2 sprays into the nose daily. , Disp: , Rfl:  .  amoxicillin-clavulanate (AUGMENTIN) 875-125 MG tablet, Take 1 tablet by mouth 2 (two) times daily., Disp: 20 tablet, Rfl: 0  Review of Systems  Constitutional: Negative for activity change and fatigue.  Respiratory: Negative for cough and shortness of breath.   Cardiovascular: Negative for chest pain, palpitations and leg swelling.  Endocrine: Negative for cold intolerance, heat intolerance, polydipsia, polyphagia and polyuria.  Musculoskeletal: Negative for arthralgias, joint swelling and myalgias.  Neurological: Negative for dizziness, light-headedness and headaches.  Psychiatric/Behavioral: Negative for agitation, self-injury, sleep disturbance and suicidal ideas. The patient is  not nervous/anxious.     Social History   Tobacco Use  . Smoking status: Former Smoker    Packs/day: 0.50    Types: Cigarettes    Quit date: 07/02/1985    Years since quitting: 33.6  . Smokeless tobacco: Never Used  Substance Use Topics  . Alcohol use: Yes    Comment: Social drinker /  monthly      Objective:   BP 122/68   Pulse 80   Temp 98.9 F (37.2 C)   Resp 16   Ht 5\' 4"  (1.626 m)   Wt 196 lb (88.9 kg)    SpO2 99%   BMI 33.64 kg/m  Vitals:   02/25/19 1330  BP: 122/68  Pulse: 80  Resp: 16  Temp: 98.9 F (37.2 C)  SpO2: 99%  Weight: 196 lb (88.9 kg)  Height: 5\' 4"  (1.626 m)     Physical Exam Vitals signs reviewed.  Constitutional:      Appearance: She is well-developed.  HENT:     Head: Normocephalic and atraumatic.     Right Ear: External ear normal.     Left Ear: External ear normal.     Nose: Nose normal.  Eyes:     Conjunctiva/sclera: Conjunctivae normal.     Pupils: Pupils are equal, round, and reactive to light.  Neck:     Musculoskeletal: Normal range of motion and neck supple.  Cardiovascular:     Rate and Rhythm: Normal rate and regular rhythm.     Heart sounds: Normal heart sounds.  Pulmonary:     Effort: Pulmonary effort is normal.     Breath sounds: Normal breath sounds.  Abdominal:     General: Bowel sounds are normal.     Palpations: Abdomen is soft.  Musculoskeletal: Normal range of motion.  Skin:    General: Skin is warm and dry.  Neurological:     Mental Status: She is alert and oriented to person, place, and time.  Psychiatric:        Behavior: Behavior normal.        Thought Content: Thought content normal.        Judgment: Judgment normal.      No results found for any visits on 02/25/19.     Assessment & Plan    1. Benign hypertension Controlled on zestoretic.RTC 6 months.  2. Gastroesophageal reflux disease without esophagitis Needs prevacid.  3. Hypothyroidism, unspecified type   4. Class 1 obesity due to excess calories with serious comorbidity and body mass index (BMI) of 32.0 to 32.9 in adult With HTN/GERD.     Steffani Dionisio Cranford Mon, MD  [facility]

## 2019-05-12 NOTE — Progress Notes (Signed)
Subjective:   Mallory Sanders is a 78 y.o. female who presents for Medicare Annual (Subsequent) preventive examination.  Review of Systems:  N/A     Objective:     Vitals: BP 132/72 (BP Location: Right Arm)   Pulse 81   Temp 98.5 F (36.9 C) (Oral)   Ht 5\' 4"  (1.626 m)   Wt 197 lb 12.8 oz (89.7 kg)   BMI 33.95 kg/m   Body mass index is 33.95 kg/m.  Advanced Directives 05/13/2019 05/08/2018 04/24/2017 04/17/2016 04/08/2015  Does Patient Have a Medical Advance Directive? Yes Yes Yes Yes Yes  Type of Paramedic of [place];Living will Living will;Healthcare Power of [place];Living will [place];Living will [place];Living will  Copy of [place] in Chart? No - copy requested No - copy requested No - copy requested - -    Tobacco Social History   Tobacco Use  Smoking Status Former Smoker  . Packs/day: 0.50  . Types: Cigarettes  . Quit date: 07/02/1985  . Years since quitting: 33.8  Smokeless Tobacco Never Used     Counseling given: Not Answered   Clinical Intake:  Pre-visit preparation completed: Yes  Pain : No/denies pain Pain Score: 0-No pain     Nutritional Status: BMI > 30  Obese Nutritional Risks: Nausea/ vomitting/ diarrhea(Nausea occasionally due to Vertigo.) Diabetes: No  How often do you need to have someone help you when you read instructions, pamphlets, or other written materials from your doctor or pharmacy?: 1 - Never  Interpreter Needed?: No  Information entered by :: [facility], LPN  Past Medical History:  Diagnosis Date  . Cancer ([place])    skin cancer on left side of face  . Gallstone   . Heart murmur 1999  . Hemorrhoids   . Hepatitis A 1962  . Hypertension   . Hypothyroidism    s/p thyroidectomy  . S/P repair of ventral hernia 09/25/2003   Lower abdominal incarcerated ventral hernia repaired with Kugel patch   Past Surgical  History:  Procedure Laterality Date  . ABDOMINAL HYSTERECTOMY  1987  . APPENDECTOMY  1950  . COLONOSCOPY  2005   Dr. Bary Castilla  . COLONOSCOPY WITH PROPOFOL N/A 02/01/2015   Procedure: COLONOSCOPY WITH PROPOFOL;  Surgeon: Robert Bellow, MD;  Location: [facility];  Service: Endoscopy;  Laterality: N/A;  . HERNIA REPAIR  2005   Lower abdominal incarcerated ventral hernia repaired with Kugel patch  . HERNIA REPAIR  [id number]   umbilical hernia repair  . 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 SKIN CANCER EXCISION  12/2014  . THYROIDECTOMY  08/2005  . TOTAL KNEE ARTHROPLASTY  04/13/2009   [facility], left knee   Family History  Problem Relation Age of Onset  . Cancer Mother   . Breast cancer Mother 6  . Leukemia Mother   . Stroke Father   . Breast cancer Maternal Aunt   . Colon cancer Maternal Uncle   . Breast cancer Cousin   . Breast cancer Cousin   . Coronary artery disease Neg Hx    Social History   Socioeconomic History  . Marital status: Widowed    Spouse name: Not on file  . Number of children: 1  . Years of education: Master's  . Highest education level: Master's degree (e.g., MA, MS, MEng, MEd, MSW, MBA)  Occupational History  . Occupation: Retired, Research officer, political party Needs  . Financial  resource strain: Not hard at all  . Food insecurity    Worry: Never true    Inability: Never true  . Transportation needs    Medical: No    Non-medical: No  Tobacco Use  . Smoking status: Former Smoker    Packs/day: 0.50    Types: Cigarettes    Quit date: 07/02/1985    Years since quitting: 33.8  . Smokeless tobacco: Never Used  Substance and Sexual Activity  . Alcohol use: Yes    Comment: seldom 1 glass of wine  . Drug use: No  . Sexual activity: Not on file  Lifestyle  . Physical activity    Days per week: 0 days    Minutes per session: 0 min  . Stress: Only a little  Relationships  . Social Herbalist on phone: Patient refused    Gets together: Patient  refused    Attends religious service: Patient refused    Active member of club or organization: Patient refused    Attends meetings of clubs or organizations: Patient refused    Relationship status: Patient refused  Other Topics Concern  . Not on file  Social History Narrative   Widowed   Does not get regular exercise    Outpatient Encounter Medications as of 05/13/2019  Medication Sig  . aspirin 81 MG tablet Take 81 mg by mouth daily.   . Calcium Carbonate-Vitamin D (CALCIUM-VITAMIN D) 500-200 MG-UNIT per tablet Take 1 tablet by mouth 3 (three) times daily.    Marland Kitchen dimenhyDRINATE (DRAMAMINE) 50 MG tablet Take 50 mg by mouth as needed.  . fexofenadine (ALLEGRA) 180 MG tablet Take 180 mg by mouth daily.   . hydrochlorothiazide (HYDRODIURIL) 12.5 MG tablet Take 12.5 mg by mouth daily.  . Ibuprofen (ADVIL) 200 MG CAPS Take 2 capsules by mouth as needed.  . lansoprazole (PREVACID) 30 MG capsule TAKE 1 CAPSULE BY MOUTH EVERY DAY  . levothyroxine (SYNTHROID, LEVOTHROID) 100 MCG tablet Take 1 tablet (100 mcg total) by mouth daily before breakfast.  . lisinopril (ZESTRIL) 10 MG tablet Take 10 mg by mouth daily.  . Multiple Vitamins-Minerals (PRESERVISION AREDS 2 PO) Take 1 tablet by mouth 2 (two) times daily.  . Nutritional Supplements (JUICE PLUS FIBRE PO) Take by mouth daily.  Marland Kitchen triamcinolone (NASACORT ALLERGY 24HR) 55 MCG/ACT AERO nasal inhaler Place 2 sprays into the nose daily.   Marland Kitchen amoxicillin-clavulanate (AUGMENTIN) 875-125 MG tablet Take 1 tablet by mouth 2 (two) times daily. (Patient not taking: Reported on 05/13/2019)  . lisinopril-hydrochlorothiazide (ZESTORETIC) 10-12.5 MG tablet TAKE ONE TABLET BY MOUTH EVERY DAY (Patient not taking: Reported on 05/13/2019)  . meclizine (ANTIVERT) 25 MG tablet Take 25 mg by mouth 3 (three) times daily as needed for dizziness.   No facility-administered encounter medications on file as of 05/13/2019.     Activities of Daily Living In your present  state of health, do you have any difficulty performing the following activities: 05/13/2019  Hearing? N  Vision? N  Difficulty concentrating or making decisions? N  Walking or climbing stairs? N  Dressing or bathing? N  Doing errands, shopping? N  Preparing Food and eating ? N  Using the Toilet? N  In the past six months, have you accidently leaked urine? Y  Comment Occasionally at night with urges. Has a prolapsed bladder.  Do you have problems with loss of bowel control? N  Managing your Medications? N  Managing your Finances? N  Housekeeping or managing your Housekeeping?  N  Some recent data might be hidden    Patient Care Team: Jerrol Banana., MD as PCP - General (Family Medicine) Bary Castilla, Forest Gleason, MD as Consulting Physician (General Surgery) Eulogio Bear, MD as Consulting Physician (Ophthalmology) Margaretha Sheffield, MD as Consulting Physician (Otolaryngology) Oneta Rack, MD as Consulting Physician (Dermatology)    Assessment:   This is a routine wellness examination for Joelisa.  Exercise Activities and Dietary recommendations Current Exercise Habits: Home exercise routine, Type of exercise: stretching, Time (Minutes): 20, Frequency (Times/Week): 5, Weekly Exercise (Minutes/Week): 100, Intensity: Mild, Exercise limited by: None identified  Goals    . Cut out extra servings     Recommend to eat 3 balanced meals a day with two healthy snack in between. Recommend to cut out extra snacks.     Marland Kitchen DIET - INCREASE WATER INTAKE     Recommend to drink at least 6-8 8oz glasses of water per day.       . Weight (lb) < 180 lb (81.6 kg)     Recommend to continue current diet plan of eating 3 small meals a day with 2 healthy snacks and increase daily water intake to help aid in weight loss.        Fall Risk: Fall Risk  05/13/2019 06/05/2018 05/08/2018 04/24/2017 04/17/2016  Falls in the past year? 0 0 0 No No  Number falls in past yr: 0 - - - -  Injury with  Fall? 0 - - - -    FALL RISK PREVENTION PERTAINING TO THE HOME:  Any stairs in or around the home? Yes  If so, are there any without handrails? No   Home free of loose throw rugs in walkways, pet beds, electrical cords, etc? Yes  Adequate lighting in your home to reduce risk of falls? Yes   ASSISTIVE DEVICES UTILIZED TO PREVENT FALLS:  Life alert? No  Use of a cane, walker or w/c? Yes Grab bars in the bathroom? Yes  Shower chair or bench in shower? No  Elevated toilet seat or a handicapped toilet? No    TIMED UP AND GO:  Was the test performed? No .    Depression Screen PHQ 2/9 Scores 05/13/2019 06/05/2018 05/08/2018 05/08/2018  PHQ - 2 Score 0 0 0 0  PHQ- 9 Score - - 0 -     Cognitive Function: Declined today.         Immunization History  Administered Date(s) Administered  . Influenza, High Dose Seasonal PF 04/08/2015, 04/17/2016, 04/01/2017, 03/25/2018, 04/15/2019  . Pneumococcal Conjugate-13 01/20/2014  . Pneumococcal Polysaccharide-23 04/17/2016  . Td 09/21/2004  . Tdap 02/27/2011  . Zoster 04/08/2006  . Zoster Recombinat (Shingrix) 07/08/2017, 09/09/2017    Qualifies for Shingles Vaccine? Completed series  Tdap: Up to date  Flu Vaccine: Up to date  Pneumococcal Vaccine: Completed series   Screening Tests Health Maintenance  Topic Date Due  . DEXA SCAN  04/19/2020  . TETANUS/TDAP  02/26/2021  . INFLUENZA VACCINE  Completed  . PNA vac Low Risk Adult  Completed    Cancer Screenings:  Colorectal Screening: No longer required.   Mammogram: No longer required.   Bone Density: Completed 04/20/15. Results reflect OSTEOPENIA. Repeat every 5 years.   Lung Cancer Screening: (Low Dose CT Chest recommended if Age 73-80 years, 30 pack-year currently smoking OR have quit w/in 15years.) does not qualify.   Additional Screening:  Vision Screening: Recommended annual ophthalmology exams for early detection of glaucoma and other  disorders of the eye.   Dental Screening: Recommended annual dental exams for proper oral hygiene  Community Resource Referral:  CRR required this visit?  No       Plan:  I have personally reviewed and addressed the Medicare Annual Wellness questionnaire and have noted the following in the patient's chart:  A. Medical and social history B. Use of alcohol, tobacco or illicit drugs  C. Current medications and supplements D. Functional ability and status E.  Nutritional status F.  Physical activity G. Advance directives H. List of other physicians I.  Hospitalizations, surgeries, and ER visits in previous 12 months J.  [place] such as hearing and vision if needed, cognitive and depression L. Referrals and appointments   In addition, I have reviewed and discussed with patient certain preventive protocols, quality metrics, and best practice recommendations. A written personalized care plan for preventive services as well as general preventive health recommendations were provided to patient. Nurse Health Advisor  Signed,    Mikeala Girdler [place], [place]  [phone] Nurse Health Advisor   Nurse Notes: None.

## 2019-05-12 NOTE — Progress Notes (Signed)
[name], Female    DOB: Jun 06, 1941, 78 y.o.   MRN: [id number] Visit Date: 05/13/2019  Today's Provider: [name], MD   Chief Complaint  Patient presents with   Annual Exam   Subjective:   Patient had AWE at [facility] today.    Annual Physical visit Mallory Sanders is a 78 y.o. female. She feels well. She reports exercising at home doing bending and squatting exercises and climbs steps at home . She reports she is sleeping well.  Colonoscopy- 02/01/2015. Dr. Bary Castilla. Entire colon is normal. Repeat 10 yrs.  Mammogram- 01/16/2019. Normal. Repeat in 1 yr.  BMD- 04/20/2015. Osteoporosis. Repeat in 2 yrs.  Pap- 06/14/2009. Normal.   Immunization History  Administered Date(s) Administered   Influenza, High Dose Seasonal PF 04/08/2015, 04/17/2016, 04/01/2017, 03/25/2018, 04/15/2019   Pneumococcal Conjugate-13 01/20/2014   Pneumococcal Polysaccharide-23 04/17/2016   Td 09/21/2004   Tdap 02/27/2011   Zoster 04/08/2006   Zoster Recombinat (Shingrix) 07/08/2017, 09/09/2017      Review of Systems  Constitutional: Negative.   HENT: Negative.   Eyes: Negative.   Respiratory: Negative.   Cardiovascular: Negative.   Gastrointestinal: Negative.   Endocrine: Negative.   Genitourinary: Negative.   Musculoskeletal: Negative.   Skin: Negative.   Allergic/Immunologic: Positive for environmental allergies.  Neurological: Negative.   Hematological: Negative.   Psychiatric/Behavioral: Negative.   Occasional vertigo since 1997.  Social History   Socioeconomic History   Marital status: Widowed    Spouse name: Not on file   Number of children: 1   Years of education: Master's   Highest education level: Master's degree (e.g., MA, MS, MEng, MEd, MSW, MBA)  Occupational History   Occupation: Retired, Scientist, research (physical sciences) strain: Not hard at all   Food insecurity    Worry: Never true    Inability: Never true    Transportation needs    Medical: No    Non-medical: No  Tobacco Use   Smoking status: Former Smoker    Packs/day: 0.50    Types: Cigarettes    Quit date: 07/02/1985    Years since quitting: 33.8   Smokeless tobacco: Never Used  Substance and Sexual Activity   Alcohol use: Yes    Comment: seldom 1 glass of wine   Drug use: No   Sexual activity: Not on file  Lifestyle   Physical activity    Days per week: 0 days    Minutes per session: 0 min   Stress: Only a little  Relationships   Press photographer on phone: Patient refused    Gets together: Patient refused    Attends religious service: Patient refused    Active member of club or organization: Patient refused    Attends meetings of clubs or organizations: Patient refused    Relationship status: Patient refused   Intimate partner violence    Fear of current or ex partner: Patient refused    Emotionally abused: Patient refused    Physically abused: Patient refused    Forced sexual activity: Patient refused  Other Topics Concern   Not on file  Social History Narrative   Widowed   Does not get regular exercise    Past Medical History:  Diagnosis Date   Cancer ([place])    skin cancer on left side of face   Gallstone    Heart murmur 1999   Hemorrhoids    Hepatitis A 1962  Hypertension    Hypothyroidism    s/p thyroidectomy   S/P repair of ventral hernia 09/25/2003   Lower abdominal incarcerated ventral hernia repaired with Kugel patch     Patient Active Problem List   Diagnosis Date Noted   Hypercholesterolemia 04/17/2016   Encounter for screening colonoscopy 01/10/2015   Abnormal finding on mammography 11/08/2014   Allergic rhinitis 11/08/2014   Family history of breast cancer 11/08/2014   Acid reflux 11/08/2014   Cardiac murmur 11/08/2014   Benign hypertension 11/08/2014   Adiposity 11/08/2014   Arthritis, degenerative 11/08/2014   Osteopenia 11/08/2014   Thyroid  nodule 62/69/4854   Umbilical hernia 62/70/3500   Hypothyroidism 10/02/2012    Past Surgical History:  Procedure Laterality Date   [place]  2005   Dr. Bary Castilla   COLONOSCOPY WITH PROPOFOL N/A 02/01/2015   Procedure: COLONOSCOPY WITH PROPOFOL;  Surgeon: Robert Bellow, MD;  Location: [facility] END[facility]COPY;  Service: Endoscopy;  Laterality: N/A;   HERNIA REPAIR  2005   Lower abdominal incarcerated ventral hernia repaired with Kugel patch   HERNIA REPAIR  9/38/18   umbilical hernia repair   REPLACEMENT TOTAL KNEE  04/26/10   right   SKIN CANCER EXCISION  12/2014   THYROIDECTOMY  08/2005   TOTAL KNEE ARTHROPLASTY  04/13/2009   [facility], left knee    Her family history includes Breast cancer in her cousin, cousin, and maternal aunt; Breast cancer (age of onset: 78) in her mother; Cancer in her mother; Colon cancer in her maternal uncle; Leukemia in her mother; Stroke in her father. There is no history of Coronary artery disease.   Current Outpatient Medications:    amoxicillin-clavulanate (AUGMENTIN) 875-125 MG tablet, Take 1 tablet by mouth 2 (two) times daily. (Patient not taking: Reported on 05/13/2019), Disp: 20 tablet, Rfl: 0   aspirin 81 MG tablet, Take 81 mg by mouth daily. , Disp: , Rfl:    Calcium Carbonate-Vitamin D (CALCIUM-VITAMIN D) 500-200 MG-UNIT per tablet, Take 1 tablet by mouth 3 (three) times daily.  , Disp: , Rfl:    dimenhyDRINATE (DRAMAMINE) 50 MG tablet, Take 50 mg by mouth as needed., Disp: , Rfl:    fexofenadine (ALLEGRA) 180 MG tablet, Take 180 mg by mouth daily. , Disp: , Rfl:    hydrochlorothiazide (HYDRODIURIL) 12.5 MG tablet, Take 1 tablet (12.5 mg total) by mouth daily., Disp: 90 tablet, Rfl: 3   Ibuprofen (ADVIL) 200 MG CAPS, Take 2 capsules by mouth as needed., Disp: , Rfl:    lansoprazole (PREVACID) 30 MG capsule, Take 1 capsule (30 mg total) by mouth daily., Disp: 90 capsule, Rfl: 3    levothyroxine (SYNTHROID, LEVOTHROID) 100 MCG tablet, Take 1 tablet (100 mcg total) by mouth daily before breakfast., Disp: 90 tablet, Rfl: 3   lisinopril (ZESTRIL) 10 MG tablet, Take 1 tablet (10 mg total) by mouth daily., Disp: 90 tablet, Rfl: 3   meclizine (ANTIVERT) 25 MG tablet, Take 25 mg by mouth 3 (three) times daily as needed for dizziness., Disp: , Rfl:    Multiple Vitamins-Minerals (PRESERVISION AREDS 2 PO), Take 1 tablet by mouth 2 (two) times daily., Disp: , Rfl:    Nutritional Supplements (JUICE PLUS FIBRE PO), Take by mouth daily., Disp: , Rfl:    triamcinolone (NASACORT ALLERGY 24HR) 55 MCG/ACT AERO nasal inhaler, Place 2 sprays into the nose daily. , Disp: , Rfl:   Patient Care Team: Jerrol Banana., MD as  PCP - General (Family Medicine) Bary Castilla, Forest Gleason, MD as Consulting Physician (General Surgery) Eulogio Bear, MD as Consulting Physician (Ophthalmology) Margaretha Sheffield, MD as Consulting Physician (Otolaryngology) Oneta Rack, MD as Consulting Physician (Dermatology)    Objective:    Vitals: BP 132/72    Pulse 81    Temp 98.5 F (36.9 C) (Oral)    Ht '5\' 4"'  (1.626 m)    Wt 197 lb (89.4 kg)    BMI 33.81 kg/m   Physical Exam Vitals signs reviewed.  Constitutional:      Appearance: She is well-developed.  HENT:     Head: Normocephalic and atraumatic.     Right Ear: External ear normal.     Left Ear: External ear normal.     Nose: Nose normal.  Eyes:     Conjunctiva/sclera: Conjunctivae normal.     Pupils: Pupils are equal, round, and reactive to light.  Neck:     Musculoskeletal: Normal range of motion and neck supple.  Cardiovascular:     Rate and Rhythm: Normal rate and regular rhythm.     Heart sounds: Normal heart sounds.  Pulmonary:     Effort: Pulmonary effort is normal.     Breath sounds: Normal breath sounds.  Chest:     Breasts:        Right: Normal.        Left: Normal.  Abdominal:     General: Bowel sounds are normal.      Palpations: Abdomen is soft.  Musculoskeletal: Normal range of motion.  Skin:    General: Skin is warm and dry.  Neurological:     Mental Status: She is alert and oriented to person, place, and time.  Psychiatric:        Mood and Affect: Mood normal.        Behavior: Behavior normal.        Thought Content: Thought content normal.        Judgment: Judgment normal.     Activities of Daily Living In your present state of health, do you have any difficulty performing the following activities: 05/13/2019  Hearing? N  Vision? N  Difficulty concentrating or making decisions? N  Walking or climbing stairs? N  Dressing or bathing? N  Doing errands, shopping? N  Preparing Food and eating ? N  Using the Toilet? N  In the past six months, have you accidently leaked urine? Y  Comment Occasionally at night with urges. Has a prolapsed bladder.  Do you have problems with loss of bowel control? N  Managing your Medications? N  Managing your Finances? N  Housekeeping or managing your Housekeeping? N  Some recent data might be hidden    Fall Risk Assessment Fall Risk  05/13/2019 06/05/2018 05/08/2018 04/24/2017 04/17/2016  Falls in the past year? 0 0 0 No No  Number falls in past yr: 0 - - - -  Injury with Fall? 0 - - - -     Depression Screen PHQ 2/9 Scores 05/13/2019 06/05/2018 05/08/2018 05/08/2018  PHQ - 2 Score 0 0 0 0  PHQ- 9 Score - - 0 -    No flowsheet data found.    Assessment & Plan:     Annual Wellness Visit  Reviewed patient's Family Medical History Reviewed and updated list of patient's medical providers Assessment of cognitive impairment was done Assessed patient's functional ability Established a written schedule for health screening [place] Completed and Reviewed  Exercise Activities  and Dietary recommendations Goals     Cut out extra servings     Recommend to eat 3 balanced meals a day with two healthy snack in between. Recommend to  cut out extra snacks.      DIET - INCREASE WATER INTAKE     Recommend to drink at least 6-8 8oz glasses of water per day.        Weight (lb) < 180 lb (81.6 kg)     Recommend to continue current diet plan of eating 3 small meals a day with 2 healthy snacks and increase daily water intake to help aid in weight loss.        Health Maintenance  Topic Date Due   DEXA SCAN  04/19/2020   TETANUS/TDAP  02/26/2021   INFLUENZA VACCINE  Completed   PNA vac Low Risk Adult  Completed     Discussed health benefits of physical activity, and encouraged her to engage in regular exercise appropriate for her age and condition.   1. Annual physical exam   2. Encounter for Medicare annual wellness exam  - lisinopril (ZESTRIL) 10 MG tablet; Take 1 tablet (10 mg total) by mouth daily.  Dispense: 90 tablet; Refill: 3 - hydrochlorothiazide (HYDRODIURIL) 12.5 MG tablet; Take 1 tablet (12.5 mg total) by mouth daily.  Dispense: 90 tablet; Refill: 3 - lansoprazole (PREVACID) 30 MG capsule; Take 1 capsule (30 mg total) by mouth daily.  Dispense: 90 capsule; Refill: 3 - CBC with Diff - Comp Met (CMET) - Lipid panel  3. Benign hypertension  - lisinopril (ZESTRIL) 10 MG tablet; Take 1 tablet (10 mg total) by mouth daily.  Dispense: 90 tablet; Refill: 3 - hydrochlorothiazide (HYDRODIURIL) 12.5 MG tablet; Take 1 tablet (12.5 mg total) by mouth daily.  Dispense: 90 tablet; Refill: 3 - CBC with Diff - Comp Met (CMET)  4. Hypercholesterolemia  - Lipid panel  5. Hypothyroidism, unspecified type   6. Osteopenia, unspecified location  - DG Bone Density  7. Gastroesophageal reflux disease without esophagitis  - lansoprazole (PREVACID) 30 MG capsule; Take 1 capsule (30 mg total) by mouth daily.  Dispense: 90 capsule; Refill: [place], MD  [facility]

## 2019-05-13 ENCOUNTER — Ambulatory Visit (INDEPENDENT_AMBULATORY_CARE_PROVIDER_SITE_OTHER): Payer: Medicare Other

## 2019-05-13 ENCOUNTER — Ambulatory Visit (INDEPENDENT_AMBULATORY_CARE_PROVIDER_SITE_OTHER): Payer: Medicare Other | Admitting: Family Medicine

## 2019-05-13 ENCOUNTER — Other Ambulatory Visit: Payer: Self-pay

## 2019-05-13 VITALS — BP 132/72 | HR 81 | Temp 98.5°F | Ht 64.0 in | Wt 197.0 lb

## 2019-05-13 VITALS — BP 132/72 | HR 81 | Temp 98.5°F | Ht 64.0 in | Wt 197.8 lb

## 2019-05-13 DIAGNOSIS — M8580 Other specified disorders of bone density and structure, unspecified site: Secondary | ICD-10-CM

## 2019-05-13 DIAGNOSIS — E039 Hypothyroidism, unspecified: Secondary | ICD-10-CM | POA: Diagnosis not present

## 2019-05-13 DIAGNOSIS — E7800 Pure hypercholesterolemia, unspecified: Secondary | ICD-10-CM

## 2019-05-13 DIAGNOSIS — Z0000 Encounter for general adult medical examination without abnormal findings: Secondary | ICD-10-CM

## 2019-05-13 DIAGNOSIS — I10 Essential (primary) hypertension: Secondary | ICD-10-CM

## 2019-05-13 DIAGNOSIS — K219 Gastro-esophageal reflux disease without esophagitis: Secondary | ICD-10-CM

## 2019-05-13 MED ORDER — LISINOPRIL 10 MG PO TABS: 10.0000 mg | ORAL_TABLET | Freq: Every day | ORAL | 3 refills | Status: DC

## 2019-05-13 MED ORDER — LANSOPRAZOLE 30 MG PO CPDR: 30.0000 mg | DELAYED_RELEASE_CAPSULE | Freq: Every day | ORAL | 3 refills | Status: DC

## 2019-05-13 MED ORDER — HYDROCHLOROTHIAZIDE 12.5 MG PO TABS: 12.5000 mg | ORAL_TABLET | Freq: Every day | ORAL | 3 refills | Status: DC

## 2019-05-13 NOTE — Patient Instructions (Signed)
[name] , Thank you for taking time to come for your Medicare Wellness Visit. I appreciate your ongoing commitment to your health goals. Please review the following plan we discussed and let me know if I can assist you in the future.   Screening recommendations/referrals: Colonoscopy: No longer required.  Mammogram: No longer required.  Bone Density: Up to date, due 04/2020 Recommended yearly ophthalmology/optometry visit for glaucoma screening and checkup Recommended yearly dental visit for hygiene and checkup  Vaccinations: Influenza vaccine: Up to date Pneumococcal vaccine: Completed series Tdap vaccine: Up to date, due 01/2021 Shingles vaccine: Completed series    Advanced directives: Please bring a copy of your POA (Power of Attorney) and/or Living Will to your next appointment.   Conditions/risks identified: Recommend to continue current diet plan of eating 3 small meals a day with 2 healthy snacks and increase daily water intake to help aid in weight loss.   Next appointment: 9:40 AM with Dr Mallory Sanders.    Preventive Care 27 Years and Older, Female Preventive care refers to lifestyle choices and visits with your health care provider that can promote health and wellness. What does preventive care include?  A yearly physical exam. This is also called an annual well check.  Dental exams once or twice a year.  Routine eye exams. Ask your health care provider how often you should have your eyes checked.  Personal lifestyle choices, including:  Daily care of your teeth and gums.  Regular physical activity.  Eating a healthy diet.  Avoiding tobacco and drug use.  Limiting alcohol use.  Practicing safe sex.  Taking low-dose aspirin every day.  Taking vitamin and mineral supplements as recommended by your health care provider. What happens during an annual well check? The services and screenings done by your health care provider during your annual well check will depend  on your age, overall health, lifestyle risk factors, and family history of disease. Counseling  Your health care provider may ask you questions about your:  Alcohol use.  Tobacco use.  Drug use.  Emotional well-being.  Home and relationship well-being.  Sexual activity.  Eating habits.  History of falls.  Memory and ability to understand (cognition).  Work and work Statistician.  Reproductive health. Screening  You may have the following tests or measurements:  Height, weight, and BMI.  Blood pressure.  Lipid and cholesterol levels. These may be checked every 5 years, or more frequently if you are over 74 years old.  Skin check.  Lung cancer screening. You may have this screening every year starting at age 92 if you have a 30-pack-year history of smoking and currently smoke or have quit within the past 15 years.  Fecal occult blood test (FOBT) of the stool. You may have this test every year starting at age 82.  Flexible sigmoidoscopy or colonoscopy. You may have a sigmoidoscopy every 5 years or a colonoscopy every 10 years starting at age 72.  Hepatitis C blood test.  Hepatitis B blood test.  Sexually transmitted disease (STD) testing.  Diabetes screening. This is done by checking your blood sugar (glucose) after you have not eaten for a while (fasting). You may have this done every 1-3 years.  Bone density scan. This is done to screen for osteoporosis. You may have this done starting at age 28.  Mammogram. This may be done every 1-2 years. Talk to your health care provider about how often you should have regular mammograms. Talk with your health care provider about  your test results, treatment options, and if necessary, the need for more tests. Vaccines  Your health care provider may recommend certain vaccines, such as:  Influenza vaccine. This is recommended every year.  Tetanus, diphtheria, and acellular pertussis (Tdap, Td) vaccine. You may need a Td  booster every 10 years.  Zoster vaccine. You may need this after age 24.  Pneumococcal 13-valent conjugate (PCV13) vaccine. One dose is recommended after age 72.  Pneumococcal polysaccharide (PPSV23) vaccine. One dose is recommended after age 14. Talk to your health care provider about which screenings and vaccines you need and how often you need them. This information is not intended to replace advice given to you by your health care provider. Make sure you discuss any questions you have with your health care provider. Document Released: 07/15/2015 Document Revised: 03/07/2016 Document Reviewed: 04/19/2015 Elsevier Interactive Patient Education  2017 [place] Prevention in the Home Falls can cause injuries. They can happen to people of all ages. There are many things you can do to make your home safe and to help prevent falls. What can I do on the outside of my home?  Regularly fix the edges of walkways and driveways and fix any cracks.  Remove anything that might make you trip as you walk through a door, such as a raised step or threshold.  Trim any bushes or trees on the path to your home.  Use bright outdoor lighting.  Clear any walking paths of anything that might make someone trip, such as rocks or tools.  Regularly check to see if handrails are loose or broken. Make sure that both sides of any steps have handrails.  Any raised decks and porches should have guardrails on the edges.  Have any leaves, snow, or ice cleared regularly.  Use sand or salt on walking paths during winter.  Clean up any spills in your garage right away. This includes oil or grease spills. What can I do in the bathroom?  Use night lights.  Install grab bars by the toilet and in the tub and shower. Do not use towel bars as grab bars.  Use non-skid mats or decals in the tub or shower.  If you need to sit down in the shower, use a plastic, non-slip stool.  Keep the floor dry. Clean up  any water that spills on the floor as soon as it happens.  Remove soap buildup in the tub or shower regularly.  Attach bath mats securely with double-sided non-slip rug tape.  Do not have throw rugs and other things on the floor that can make you trip. What can I do in the bedroom?  Use night lights.  Make sure that you have a light by your bed that is easy to reach.  Do not use any sheets or blankets that are too big for your bed. They should not hang down onto the floor.  Have a firm chair that has side arms. You can use this for support while you get dressed.  Do not have throw rugs and other things on the floor that can make you trip. What can I do in the kitchen?  Clean up any spills right away.  Avoid walking on wet floors.  Keep items that you use a lot in easy-to-reach places.  If you need to reach something above you, use a strong step stool that has a grab bar.  Keep electrical cords out of the way.  Do not use floor polish or wax  that makes floors slippery. If you must use wax, use non-skid floor wax.  Do not have throw rugs and other things on the floor that can make you trip. What can I do with my stairs?  Do not leave any items on the stairs.  Make sure that there are handrails on both sides of the stairs and use them. Fix handrails that are broken or loose. Make sure that handrails are as long as the stairways.  Check any carpeting to make sure that it is firmly attached to the stairs. Fix any carpet that is loose or worn.  Avoid having throw rugs at the top or bottom of the stairs. If you do have throw rugs, attach them to the floor with carpet tape.  Make sure that you have a light switch at the top of the stairs and the bottom of the stairs. If you do not have them, ask someone to add them for you. What else can I do to help prevent falls?  Wear shoes that:  Do not have high heels.  Have rubber bottoms.  Are comfortable and fit you well.  Are  closed at the toe. Do not wear sandals.  If you use a stepladder:  Make sure that it is fully opened. Do not climb a closed stepladder.  Make sure that both sides of the stepladder are locked into place.  Ask someone to hold it for you, if possible.  Clearly mark and make sure that you can see:  Any grab bars or handrails.  First and last steps.  Where the edge of each step is.  Use tools that help you move around (mobility aids) if they are needed. These include:  Canes.  Walkers.  Scooters.  Crutches.  Turn on the lights when you go into a dark area. Replace any light bulbs as soon as they burn out.  Set up your furniture so you have a clear path. Avoid moving your furniture around.  If any of your floors are uneven, fix them.  If there are any pets around you, be aware of where they are.  Review your medicines with your doctor. Some medicines can make you feel dizzy. This can increase your chance of falling. Ask your doctor what other things that you can do to help prevent falls. This information is not intended to replace advice given to you by your health care provider. Make sure you discuss any questions you have with your health care provider. Document Released: 04/14/2009 Document Revised: 11/24/2015 Document Reviewed: 07/23/2014 Elsevier Interactive Patient Education  2017 [employer].

## 2019-05-14 LAB — CBC WITH DIFFERENTIAL/PLATELET
Basophils Absolute: 0.1 10*3/uL (ref 0.0–0.2)
Basos: 1 %
EOS (ABSOLUTE): 0.1 10*3/uL (ref 0.0–0.4)
Eos: 1 %
Hematocrit: 39.3 % (ref 34.0–46.6)
Hemoglobin: 13.3 g/dL (ref 11.1–15.9)
Immature Grans (Abs): 0 10*3/uL (ref 0.0–0.1)
Immature Granulocytes: 0 %
Lymphocytes Absolute: 1.3 10*3/uL (ref 0.7–3.1)
Lymphs: 25 %
MCH: 32.6 pg (ref 26.6–33.0)
MCHC: 33.8 g/dL (ref 31.5–35.7)
MCV: 96 fL (ref 79–97)
Monocytes Absolute: 0.5 10*3/uL (ref 0.1–0.9)
Monocytes: 11 %
Neutrophils Absolute: 3.1 10*3/uL (ref 1.4–7.0)
Neutrophils: 62 %
Platelets: 247 10*3/uL (ref 150–450)
RBC: 4.08 x10E6/uL (ref 3.77–5.28)
RDW: 12.2 % (ref 11.7–15.4)
WBC: 5 10*3/uL (ref 3.4–10.8)

## 2019-05-14 LAB — COMPREHENSIVE METABOLIC PANEL
ALT: 12 IU/L (ref 0–32)
AST: 20 IU/L (ref 0–40)
Albumin/Globulin Ratio: 2.6 — ABNORMAL HIGH (ref 1.2–2.2)
Albumin: 5 g/dL — ABNORMAL HIGH (ref 3.7–4.7)
Alkaline Phosphatase: 95 IU/L (ref 39–117)
BUN/Creatinine Ratio: 15 (ref 12–28)
BUN: 15 mg/dL (ref 8–27)
Bilirubin Total: 0.4 mg/dL (ref 0.0–1.2)
CO2: 23 mmol/L (ref 20–29)
Calcium: 9.7 mg/dL (ref 8.7–10.3)
Chloride: 92 mmol/L — ABNORMAL LOW (ref 96–106)
Creatinine, Ser: 0.99 mg/dL (ref 0.57–1.00)
GFR calc Af Amer: 62 mL/min/{1.73_m2} (ref >59)
GFR calc non Af Amer: 54 mL/min/{1.73_m2} — ABNORMAL LOW (ref >59)
Globulin, Total: 1.9 g/dL (ref 1.5–4.5)
Glucose: 93 mg/dL (ref 65–99)
Potassium: 4.6 mmol/L (ref 3.5–5.2)
Sodium: 131 mmol/L — ABNORMAL LOW (ref 134–144)
Total Protein: 6.9 g/dL (ref 6.0–8.5)

## 2019-05-14 LAB — LIPID PANEL
Chol/HDL Ratio: 2.8 ratio (ref 0.0–4.4)
Cholesterol, Total: 222 mg/dL — ABNORMAL HIGH (ref 100–199)
HDL: 80 mg/dL (ref >39)
LDL Chol Calc (NIH): 132 mg/dL — ABNORMAL HIGH (ref 0–99)
Triglycerides: 57 mg/dL (ref 0–149)
VLDL Cholesterol Cal: 10 mg/dL (ref 5–40)

## 2019-06-03 ENCOUNTER — Other Ambulatory Visit: Payer: Medicare Other

## 2019-06-15 ENCOUNTER — Other Ambulatory Visit: Payer: Medicare Other

## 2019-08-19 ENCOUNTER — Other Ambulatory Visit: Payer: Medicare Other

## 2020-01-21 ENCOUNTER — Other Ambulatory Visit: Payer: Self-pay | Admitting: Family Medicine

## 2020-01-21 DIAGNOSIS — Z1231 Encounter for screening mammogram for malignant neoplasm of breast: Secondary | ICD-10-CM

## 2020-02-15 ENCOUNTER — Other Ambulatory Visit: Payer: Self-pay

## 2020-02-15 ENCOUNTER — Ambulatory Visit
Admission: RE | Admit: 2020-02-15 | Discharge: 2020-02-15 | Disposition: A | Discharge status: Home / Self Care | Payer: Medicare PPO | Source: Ambulatory Visit | Attending: Family Medicine | Admitting: Family Medicine

## 2020-02-15 DIAGNOSIS — Z1231 Encounter for screening mammogram for malignant neoplasm of breast: Secondary | ICD-10-CM | POA: Diagnosis not present

## 2020-05-17 NOTE — Progress Notes (Signed)
Subjective:   Mallory Sanders is a 79 y.o. female who presents for Medicare Annual (Subsequent) preventive examination.  Review of Systems    N/A  Cardiac Risk Factors include: advanced age (>38men, >18 women);obesity (BMI >30kg/m2);hypertension     Objective:    Today's Vitals   05/18/20 0911 05/18/20 0934  BP: (!) 146/76 (!) 146/80  Pulse: (!) 47 68  Temp: 98.6 F (37 C)   SpO2: 98%   Weight: 197 lb 9.6 oz (89.6 kg)   Height: 5\' 4"  (1.626 m)   PainSc: 0-No pain    Body mass index is 33.92 kg/m.  Advanced Directives 05/18/2020 05/13/2019 05/08/2018 04/24/2017 04/17/2016 04/08/2015  Does Patient Have a Medical Advance Directive? Yes Yes Yes Yes Yes Yes  Type of Paramedic of [place];Living will [place];Living will Living will;Healthcare Power of [place];Living will [place];Living will [place];Living will  Copy of [place] in Chart? No - copy requested No - copy requested No - copy requested No - copy requested - -    Current Medications (verified) Outpatient Encounter Medications as of 05/18/2020  Medication Sig  . aspirin 81 MG tablet Take 81 mg by mouth daily.   . Calcium Carbonate-Vitamin D (CALCIUM-VITAMIN D) 500-200 MG-UNIT per tablet Take 1 tablet by mouth 3 (three) times daily.    Marland Kitchen dimenhyDRINATE (DRAMAMINE) 50 MG tablet Take 50 mg by mouth as needed.  . fexofenadine (ALLEGRA) 180 MG tablet Take 180 mg by mouth daily.   . hydrochlorothiazide (HYDRODIURIL) 12.5 MG tablet Take 1 tablet (12.5 mg total) by mouth daily.  . Ibuprofen (ADVIL) 200 MG CAPS Take 2 capsules by mouth as needed.  . lansoprazole (PREVACID) 30 MG capsule Take 1 capsule (30 mg total) by mouth daily.  Marland Kitchen levothyroxine (SYNTHROID, LEVOTHROID) 100 MCG tablet Take 1 tablet (100 mcg total) by mouth daily before breakfast.  . lisinopril (ZESTRIL) 10 MG tablet Take 1  tablet (10 mg total) by mouth daily.  . meclizine (ANTIVERT) 25 MG tablet Take 25 mg by mouth 3 (three) times daily as needed for dizziness.  . Multiple Vitamins-Minerals (PRESERVISION AREDS 2 PO) Take 1 tablet by mouth 2 (two) times daily.  . Nutritional Supplements (JUICE PLUS FIBRE PO) Take 2 capsules by mouth 2 (two) times daily.   Marland Kitchen triamcinolone (NASACORT ALLERGY 24HR) 55 MCG/ACT AERO nasal inhaler Place 2 sprays into the nose daily.   Marland Kitchen amoxicillin-clavulanate (AUGMENTIN) 875-125 MG tablet Take 1 tablet by mouth 2 (two) times daily. (Patient not taking: Reported on 05/13/2019)   No facility-administered encounter medications on file as of 05/18/2020.    Allergies (verified) Propoxyphene n-acetaminophen, Streptomycin, Tramadol hcl, and Ultra antioxidant formula [anti-oxidant]   History: Past Medical History:  Diagnosis Date  . Cancer ([place])    skin cancer on left side of face  . Gallstone   . Heart murmur 1999  . Hemorrhoids   . Hepatitis A 1962  . Hypertension   . Hypothyroidism    s/p thyroidectomy  . S/P repair of ventral hernia 09/25/2003   Lower abdominal incarcerated ventral hernia repaired with Kugel patch   Past Surgical History:  Procedure Laterality Date  . ABDOMINAL HYSTERECTOMY  1987  . APPENDECTOMY  1950  . COLONOSCOPY  2005   Dr. Bary Castilla  . COLONOSCOPY WITH PROPOFOL N/A 02/01/2015   Procedure: COLONOSCOPY WITH PROPOFOL;  Surgeon: Robert Bellow, MD;  Location: [facility] ENDOSCOPY;  Service:  Endoscopy;  Laterality: N/A;  . HERNIA REPAIR  2005   Lower abdominal incarcerated ventral hernia repaired with Kugel patch  . HERNIA REPAIR  03/15/77   umbilical hernia repair  . 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 SKIN CANCER EXCISION  12/2014  . THYROIDECTOMY  08/2005  . TOTAL KNEE ARTHROPLASTY  04/13/2009   [facility], left knee   Family History  Problem Relation Age of Onset  . Cancer Mother   . Breast cancer Mother 12  . Leukemia Mother   . Stroke Father     . Breast cancer Maternal Aunt   . Colon cancer Maternal Uncle   . Breast cancer Cousin   . Breast cancer Cousin   . Coronary artery disease Neg Hx    Social History   Socioeconomic History  . Marital status: Widowed    Spouse name: Not on file  . Number of children: 1  . Years of education: Master's  . Highest education level: Master's degree (e.g., MA, MS, MEng, MEd, MSW, MBA)  Occupational History  . Occupation: Retired, Pharmacist, hospital  Tobacco Use  . Smoking status: Former Smoker    Packs/day: 0.50    Types: Cigarettes    Quit date: 07/02/1985    Years since quitting: 34.9  . Smokeless tobacco: Never Used  Vaping Use  . Vaping Use: Never used  Substance and Sexual Activity  . Alcohol use: Yes    Comment: seldom 1 glass of wine  . Drug use: No  . Sexual activity: Not on file  Other Topics Concern  . Not on file  Social History Narrative   Widowed   Does not get regular exercise   Social Determinants of Health   Financial Resource Strain: Low Risk   . Difficulty of Paying Living Expenses: Not hard at all  Food Insecurity: No Food Insecurity  . Worried About Charity fundraiser in the Last Year: Never true  . Ran Out of Food in the Last Year: Never true  Transportation Needs: No Transportation Needs  . Lack of Transportation (Medical): No  . Lack of Transportation (Non-Medical): No  Physical Activity: Inactive  . Days of Exercise per Week: 0 days  . Minutes of Exercise per Session: 0 min  Stress: No Stress Concern Present  . Feeling of Stress : Not at all  Social Connections: Moderately Isolated  . Frequency of Communication with Friends and Family: More than three times a week  . Frequency of Social Gatherings with Friends and Family: More than three times a week  . Attends Religious Services: Never  . Active Member of Clubs or Organizations: Yes  . Attends Archivist Meetings: More than 4 times per year  . Marital Status: Widowed    Tobacco  Counseling Counseling given: Not Answered   Clinical Intake:  Pre-visit preparation completed: Yes  Pain : No/denies pain Pain Score: 0-No pain     Nutritional Status: BMI > 30  Obese Nutritional Risks: None Diabetes: No  How often do you need to have someone help you when you read instructions, pamphlets, or other written materials from your doctor or pharmacy?: 1 - Never  Diabetic? No  Interpreter Needed?: No  Information entered by :: [facility], LPN   Activities of Daily Living In your present state of health, do you have any difficulty performing the following activities: 05/18/2020  Hearing? N  Vision? N  Difficulty concentrating or making decisions? N  Walking or climbing stairs? N  Dressing or bathing? N  Doing errands, shopping? N  Preparing Food and eating ? N  Using the Toilet? N  In the past six months, have you accidently leaked urine? N  Do you have problems with loss of bowel control? N  Managing your Medications? N  Managing your Finances? N  Housekeeping or managing your Housekeeping? N  Some recent data might be hidden    Patient Care Team: Jerrol Banana., MD as PCP - General (Family Medicine) Bary Castilla, Forest Gleason, MD as Consulting Physician (General Surgery) Eulogio Bear, MD as Consulting Physician (Ophthalmology) Margaretha Sheffield, MD as Consulting Physician (Otolaryngology) Oneta Rack, MD as Consulting Physician (Dermatology)  Indicate any recent Medical Services you may have received from other than [facility] providers in the past year (date may be approximate).     Assessment:   This is a routine wellness examination for [place].  Hearing/Vision screen No exam data present  Dietary issues and exercise activities discussed: Current Exercise Habits: Home exercise routine, Type of exercise: stretching, Time (Minutes): 10, Frequency (Times/Week): 7, Weekly Exercise (Minutes/Week): 70, Intensity: Mild, Exercise limited by:  orthopedic condition(s)  Goals    . Weight (lb) < 180 lb (81.6 kg)     Recommend to continue current diet plan of eating 3 small meals a day with 2 healthy snacks and increase daily water intake to help aid in weight loss.       Depression Screen PHQ 2/9 Scores 05/18/2020 05/13/2019 06/05/2018 05/08/2018 05/08/2018 04/24/2017 04/24/2017  PHQ - 2 Score 0 0 0 0 0 0 0  PHQ- 9 Score - - - 0 - 0 -    Fall Risk Fall Risk  05/18/2020 05/13/2019 06/05/2018 05/08/2018 04/24/2017  Falls in the past year? 0 0 0 0 No  Number falls in past yr: 0 0 - - -  Injury with Fall? 0 0 - - -    Any stairs in or around the home? Yes  If so, are there any without handrails? No  Home free of loose throw rugs in walkways, pet beds, electrical cords, etc? Yes  Adequate lighting in your home to reduce risk of falls? Yes   ASSISTIVE DEVICES UTILIZED TO PREVENT FALLS:  Life alert? No  Use of a cane, walker or w/c? No  Grab bars in the bathroom? Yes  Shower chair or bench in shower? No  Elevated toilet seat or a handicapped toilet? No   TIMED UP AND GO:  Was the test performed? Yes .  Length of time to ambulate 10 feet: 12 sec.   Gait slow and steady without use of assistive device  Cognitive Function:        Immunizations Immunization History  Administered Date(s) Administered  . Influenza, High Dose Seasonal PF 04/08/2015, 04/17/2016, 04/01/2017, 03/25/2018, 04/15/2019, 04/30/2020  . PFIZER SARS-COV-2 Vaccination 07/22/2019, 08/05/2019, 04/01/2020  . Pneumococcal Conjugate-13 01/20/2014  . Pneumococcal Polysaccharide-23 04/17/2016  . Td 09/21/2004  . Tdap 02/27/2011  . Zoster 04/08/2006  . Zoster Recombinat (Shingrix) 07/08/2017, 09/09/2017    TDAP status: Up to date Flu Vaccine status: Up to date Pneumococcal vaccine status: Up to date Covid-19 vaccine status: Completed vaccines  Qualifies for Shingles Vaccine? Yes   Zostavax completed Yes   Shingrix Completed?: Yes  Screening  Tests Health Maintenance  Topic Date Due  . DEXA SCAN  04/19/2020  . TETANUS/TDAP  02/26/2021  . INFLUENZA VACCINE  Completed  . COVID-19 Vaccine  Completed  . PNA vac Low Risk  Adult  Completed    Health Maintenance  Health Maintenance Due  Topic Date Due  . DEXA SCAN  04/19/2020    Colorectal cancer screening: No longer required.  Mammogram status: No longer required.  Bone Density status: Ordered today. Pt provided with contact info and advised to call to schedule appt.  Lung Cancer Screening: (Low Dose CT Chest recommended if Age 32-80 years, 30 pack-year currently smoking OR have quit w/in 15years.) does not qualify.    Additional Screening:  Vision Screening: Recommended annual ophthalmology exams for early detection of glaucoma and other disorders of the eye. Is the patient up to date with their annual eye exam?  Yes  Who is the provider or what is the name of the office in which the patient attends annual eye exams? Dr Edison Pace @ [place] If pt is not established with a provider, would they like to be referred to a provider to establish care? No .   Dental Screening: Recommended annual dental exams for proper oral hygiene  Community Resource Referral / Chronic Care Management: CRR required this visit?  No   CCM required this visit?  No      Plan:     I have personally reviewed and noted the following in the patient's chart:   . Medical and social history . Use of alcohol, tobacco or illicit drugs  . Current medications and supplements . Functional ability and status . Nutritional status . Physical activity . Advanced directives . List of other physicians . Hospitalizations, surgeries, and ER visits in previous 12 months . Vitals . Screenings to include cognitive, depression, and falls . Referrals and appointments  In addition, I have reviewed and discussed with patient certain preventive protocols, quality metrics, and best practice recommendations. A written  personalized care plan for preventive services as well as general preventive health recommendations were provided to patient.     Jarica Plass [place], [place]   [id number]   Nurse Notes: None.

## 2020-05-18 ENCOUNTER — Ambulatory Visit (INDEPENDENT_AMBULATORY_CARE_PROVIDER_SITE_OTHER): Payer: Medicare PPO | Admitting: Family Medicine

## 2020-05-18 ENCOUNTER — Ambulatory Visit (INDEPENDENT_AMBULATORY_CARE_PROVIDER_SITE_OTHER): Payer: Medicare PPO

## 2020-05-18 ENCOUNTER — Other Ambulatory Visit: Payer: Self-pay

## 2020-05-18 ENCOUNTER — Encounter: Payer: Self-pay | Admitting: Family Medicine

## 2020-05-18 VITALS — BP 136/70 | HR 68 | Temp 98.6°F | Resp 16 | Ht 64.0 in | Wt 197.0 lb

## 2020-05-18 VITALS — BP 146/80 | HR 68 | Temp 98.6°F | Ht 64.0 in | Wt 197.6 lb

## 2020-05-18 DIAGNOSIS — E6609 Other obesity due to excess calories: Secondary | ICD-10-CM

## 2020-05-18 DIAGNOSIS — I10 Essential (primary) hypertension: Secondary | ICD-10-CM

## 2020-05-18 DIAGNOSIS — E039 Hypothyroidism, unspecified: Secondary | ICD-10-CM

## 2020-05-18 DIAGNOSIS — E2839 Other primary ovarian failure: Secondary | ICD-10-CM

## 2020-05-18 DIAGNOSIS — Z0000 Encounter for general adult medical examination without abnormal findings: Secondary | ICD-10-CM

## 2020-05-18 DIAGNOSIS — E66811 Obesity, class 1: Secondary | ICD-10-CM

## 2020-05-18 DIAGNOSIS — E7800 Pure hypercholesterolemia, unspecified: Secondary | ICD-10-CM

## 2020-05-18 DIAGNOSIS — Z6832 Body mass index (BMI) 32.0-32.9, adult: Secondary | ICD-10-CM

## 2020-05-18 NOTE — Patient Instructions (Signed)
Mallory Sanders , Thank you for taking time to come for your Medicare Wellness Visit. I appreciate your ongoing commitment to your health goals. Please review the following plan we discussed and let me know if I can assist you in the future.   Screening recommendations/referrals: Colonoscopy: No longer required.  Mammogram: No longer required.  Bone Density: Ordered today. Pt aware office will contact her to schedule apt. Recommended yearly ophthalmology/optometry visit for glaucoma screening and checkup Recommended yearly dental visit for hygiene and checkup  Vaccinations: Influenza vaccine: Done 04/2020. Pneumococcal vaccine: Completed series Tdap vaccine: Up to date, due 01/2021 Shingles vaccine: Completed series    Advanced directives: Please bring a copy of your POA (Power of Attorney) and/or Living Will to your next appointment.   Conditions/risks identified: Obesity- recommend to continue current diet plan of eating 3 small meals a day with 2 healthy snacks and increase daily water intake to help aid in weight loss.   Next appointment: 9:40 AM today with Dr Rosanna Randy    Preventive Care 23 Years and Older, Female Preventive care refers to lifestyle choices and visits with your health care provider that can promote health and wellness. What does preventive care include?  A yearly physical exam. This is also called an annual well check.  Dental exams once or twice a year.  Routine eye exams. Ask your health care provider how often you should have your eyes checked.  Personal lifestyle choices, including:  Daily care of your teeth and gums.  Regular physical activity.  Eating a healthy diet.  Avoiding tobacco and drug use.  Limiting alcohol use.  Practicing safe sex.  Taking low-dose aspirin every day.  Taking vitamin and mineral supplements as recommended by your health care provider. What happens during an annual well check? The services and screenings done by your  health care provider during your annual well check will depend on your age, overall health, lifestyle risk factors, and family history of disease. Counseling  Your health care provider may ask you questions about your:  Alcohol use.  Tobacco use.  Drug use.  Emotional well-being.  Home and relationship well-being.  Sexual activity.  Eating habits.  History of falls.  Memory and ability to understand (cognition).  Work and work Statistician.  Reproductive health. Screening  You may have the following tests or measurements:  Height, weight, and BMI.  Blood pressure.  Lipid and cholesterol levels. These may be checked every 5 years, or more frequently if you are over 78 years old.  Skin check.  Lung cancer screening. You may have this screening every year starting at age 52 if you have a 30-pack-year history of smoking and currently smoke or have quit within the past 15 years.  Fecal occult blood test (FOBT) of the stool. You may have this test every year starting at age 47.  Flexible sigmoidoscopy or colonoscopy. You may have a sigmoidoscopy every 5 years or a colonoscopy every 10 years starting at age 63.  Hepatitis C blood test.  Hepatitis B blood test.  Sexually transmitted disease (STD) testing.  Diabetes screening. This is done by checking your blood sugar (glucose) after you have not eaten for a while (fasting). You may have this done every 1-3 years.  Bone density scan. This is done to screen for osteoporosis. You may have this done starting at age 46.  Mammogram. This may be done every 1-2 years. Talk to your health care provider about how often you should have regular mammograms.  Talk with your health care provider about your test results, treatment options, and if necessary, the need for more tests. Vaccines  Your health care provider may recommend certain vaccines, such as:  Influenza vaccine. This is recommended every year.  Tetanus, diphtheria, and  acellular pertussis (Tdap, Td) vaccine. You may need a Td booster every 10 years.  Zoster vaccine. You may need this after age 73.  Pneumococcal 13-valent conjugate (PCV13) vaccine. One dose is recommended after age 100.  Pneumococcal polysaccharide (PPSV23) vaccine. One dose is recommended after age 66. Talk to your health care provider about which screenings and vaccines you need and how often you need them. This information is not intended to replace advice given to you by your health care provider. Make sure you discuss any questions you have with your health care provider. Document Released: 07/15/2015 Document Revised: 03/07/2016 Document Reviewed: 04/19/2015 Elsevier Interactive Patient Education  2017 [place] Prevention in the Home Falls can cause injuries. They can happen to people of all ages. There are many things you can do to make your home safe and to help prevent falls. What can I do on the outside of my home?  Regularly fix the edges of walkways and driveways and fix any cracks.  Remove anything that might make you trip as you walk through a door, such as a raised step or threshold.  Trim any bushes or trees on the path to your home.  Use bright outdoor lighting.  Clear any walking paths of anything that might make someone trip, such as rocks or tools.  Regularly check to see if handrails are loose or broken. Make sure that both sides of any steps have handrails.  Any raised decks and porches should have guardrails on the edges.  Have any leaves, snow, or ice cleared regularly.  Use sand or salt on walking paths during winter.  Clean up any spills in your garage right away. This includes oil or grease spills. What can I do in the bathroom?  Use night lights.  Install grab bars by the toilet and in the tub and shower. Do not use towel bars as grab bars.  Use non-skid mats or decals in the tub or shower.  If you need to sit down in the shower, use  a plastic, non-slip stool.  Keep the floor dry. Clean up any water that spills on the floor as soon as it happens.  Remove soap buildup in the tub or shower regularly.  Attach bath mats securely with double-sided non-slip rug tape.  Do not have throw rugs and other things on the floor that can make you trip. What can I do in the bedroom?  Use night lights.  Make sure that you have a light by your bed that is easy to reach.  Do not use any sheets or blankets that are too big for your bed. They should not hang down onto the floor.  Have a firm chair that has side arms. You can use this for support while you get dressed.  Do not have throw rugs and other things on the floor that can make you trip. What can I do in the kitchen?  Clean up any spills right away.  Avoid walking on wet floors.  Keep items that you use a lot in easy-to-reach places.  If you need to reach something above you, use a strong step stool that has a grab bar.  Keep electrical cords out of the way.  Do not use floor polish or wax that makes floors slippery. If you must use wax, use non-skid floor wax.  Do not have throw rugs and other things on the floor that can make you trip. What can I do with my stairs?  Do not leave any items on the stairs.  Make sure that there are handrails on both sides of the stairs and use them. Fix handrails that are broken or loose. Make sure that handrails are as long as the stairways.  Check any carpeting to make sure that it is firmly attached to the stairs. Fix any carpet that is loose or worn.  Avoid having throw rugs at the top or bottom of the stairs. If you do have throw rugs, attach them to the floor with carpet tape.  Make sure that you have a light switch at the top of the stairs and the bottom of the stairs. If you do not have them, ask someone to add them for you. What else can I do to help prevent falls?  Wear shoes that:  Do not have high heels.  Have  rubber bottoms.  Are comfortable and fit you well.  Are closed at the toe. Do not wear sandals.  If you use a stepladder:  Make sure that it is fully opened. Do not climb a closed stepladder.  Make sure that both sides of the stepladder are locked into place.  Ask someone to hold it for you, if possible.  Clearly mark and make sure that you can see:  Any grab bars or handrails.  First and last steps.  Where the edge of each step is.  Use tools that help you move around (mobility aids) if they are needed. These include:  Canes.  Walkers.  Scooters.  Crutches.  Turn on the lights when you go into a dark area. Replace any light bulbs as soon as they burn out.  Set up your furniture so you have a clear path. Avoid moving your furniture around.  If any of your floors are uneven, fix them.  If there are any pets around you, be aware of where they are.  Review your medicines with your doctor. Some medicines can make you feel dizzy. This can increase your chance of falling. Ask your doctor what other things that you can do to help prevent falls. This information is not intended to replace advice given to you by your health care provider. Make sure you discuss any questions you have with your health care provider. Document Released: 04/14/2009 Document Revised: 11/24/2015 Document Reviewed: 07/23/2014 Elsevier Interactive Patient Education  2017 [employer].

## 2020-05-18 NOTE — Progress Notes (Signed)
Complete physical exam   [name]   DOB: 14-Oct-1940   79 y.o. Female  MRN: [id number] Visit Date: 05/18/2020  Today's healthcare provider: [name], MD   Chief Complaint  Patient presents with  . Annual Exam   Subjective    Mallory Sanders is a 79 y.o. female who presents today for a complete physical exam.  She reports consuming a general diet. Home exercise routine includes stretching. She generally feels well. She reports sleeping well. She does not have additional problems to discuss today.  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  Neither of them are progressive. Patient had AWV with [facility] today at 9:00 am. 02/15/2020 Mammogram-BI-RADS 1 04/20/2015 BMD-Osteopenia 02/01/2015 Colonoscopy-normal  Past Medical History:  Diagnosis Date  . Cancer ([place])    skin cancer on left side of face  . Gallstone   . Heart murmur 1999  . Hemorrhoids   . Hepatitis A 1962  . Hypertension   . Hypothyroidism    s/p thyroidectomy  . S/P repair of ventral hernia 09/25/2003   Lower abdominal incarcerated ventral hernia repaired with Kugel patch   Past Surgical History:  Procedure Laterality Date  . ABDOMINAL HYSTERECTOMY  1987  . APPENDECTOMY  1950  . COLONOSCOPY  2005   Dr. Bary Castilla  . COLONOSCOPY WITH PROPOFOL N/A 02/01/2015   Procedure: COLONOSCOPY WITH PROPOFOL;  Surgeon: Robert Bellow, MD;  Location: [facility] ENDOSCOPY;  Service: Endoscopy;  Laterality: N/A;  . HERNIA REPAIR  2005   Lower abdominal incarcerated ventral hernia repaired with Kugel patch  . HERNIA REPAIR  10/09/79   umbilical hernia repair  . 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 SKIN CANCER EXCISION  12/2014  . THYROIDECTOMY  08/2005  . TOTAL KNEE ARTHROPLASTY  04/13/2009   [facility], left knee   Social History   Socioeconomic History  . Marital status: Widowed    Spouse name: Not on file  . Number of children: 1  . Years of education: Master's    . Highest education level: Master's degree (e.g., MA, MS, MEng, MEd, MSW, MBA)  Occupational History  . Occupation: Retired, Pharmacist, hospital  Tobacco Use  . Smoking status: Former Smoker    Packs/day: 0.50    Types: Cigarettes    Quit date: 07/02/1985    Years since quitting: 34.9  . Smokeless tobacco: Never Used  Vaping Use  . Vaping Use: Never used  Substance and Sexual Activity  . Alcohol use: Yes    Comment: seldom 1 glass of wine  . Drug use: No  . Sexual activity: Not on file  Other Topics Concern  . Not on file  Social History Narrative   Widowed   Does not get regular exercise   Social Determinants of Health   Financial Resource Strain: Low Risk   . Difficulty of Paying Living Expenses: Not hard at all  Food Insecurity: No Food Insecurity  . Worried About Charity fundraiser in the Last Year: Never true  . Ran Out of Food in the Last Year: Never true  Transportation Needs: No Transportation Needs  . Lack of Transportation (Medical): No  . Lack of Transportation (Non-Medical): No  Physical Activity: Inactive  . Days of Exercise per Week: 0 days  . Minutes of Exercise per Session: 0 min  Stress: No Stress Concern Present  . Feeling of Stress : Not at all  Social Connections: Moderately Isolated  .  Frequency of Communication with Friends and Family: More than three times a week  . Frequency of Social Gatherings with Friends and Family: More than three times a week  . Attends Religious Services: Never  . Active Member of Clubs or Organizations: Yes  . Attends Archivist Meetings: More than 4 times per year  . Marital Status: Widowed  Intimate Partner Violence: Not At Risk  . Fear of Current or Ex-Partner: No  . Emotionally Abused: No  . Physically Abused: No  . Sexually Abused: No   Family Status  Relation Name Status  . Mother  Deceased at age 75       Leukemia  . Father  Deceased at age 49  . Mat Aunt half sister Deceased  . Mat Uncle  Deceased  .  Cousin maternal (Not Specified)  . Cousin maternal (Not Specified)  . Daughter  Alive  . Neg Hx  (Not Specified)   Family History  Problem Relation Age of Onset  . Cancer Mother   . Breast cancer Mother 43  . Leukemia Mother   . Stroke Father   . Breast cancer Maternal Aunt   . Colon cancer Maternal Uncle   . Breast cancer Cousin   . Breast cancer Cousin   . Coronary artery disease Neg Hx    Allergies  Allergen Reactions  . Propoxyphene N-Acetaminophen   . Streptomycin   . Tramadol Hcl   . Ultra Antioxidant Formula [Anti-Oxidant] Nausea Only    Patient Care Team: Jerrol Banana., MD as PCP - General (Family Medicine) Bary Castilla, Forest Gleason, MD as Consulting Physician (General Surgery) Eulogio Bear, MD as Consulting Physician (Ophthalmology) Margaretha Sheffield, MD as Consulting Physician (Otolaryngology) Oneta Rack, MD as Consulting Physician (Dermatology)   Medications: Outpatient Medications Prior to Visit  Medication Sig  . aspirin 81 MG tablet Take 81 mg by mouth daily.   . Calcium Carbonate-Vitamin D (CALCIUM-VITAMIN D) 500-200 MG-UNIT per tablet Take 1 tablet by mouth 3 (three) times daily.    Marland Kitchen dimenhyDRINATE (DRAMAMINE) 50 MG tablet Take 50 mg by mouth as needed.  . fexofenadine (ALLEGRA) 180 MG tablet Take 180 mg by mouth daily.   . hydrochlorothiazide (HYDRODIURIL) 12.5 MG tablet Take 1 tablet (12.5 mg total) by mouth daily.  . Ibuprofen (ADVIL) 200 MG CAPS Take 2 capsules by mouth as needed.  . lansoprazole (PREVACID) 30 MG capsule Take 1 capsule (30 mg total) by mouth daily.  Marland Kitchen levothyroxine (SYNTHROID, LEVOTHROID) 100 MCG tablet Take 1 tablet (100 mcg total) by mouth daily before breakfast.  . lisinopril (ZESTRIL) 10 MG tablet Take 1 tablet (10 mg total) by mouth daily.  . meclizine (ANTIVERT) 25 MG tablet Take 25 mg by mouth 3 (three) times daily as needed for dizziness.  . Multiple Vitamins-Minerals (PRESERVISION AREDS 2 PO) Take 1 tablet by mouth  2 (two) times daily.  . Nutritional Supplements (JUICE PLUS FIBRE PO) Take 2 capsules by mouth 2 (two) times daily.   Marland Kitchen triamcinolone (NASACORT ALLERGY 24HR) 55 MCG/ACT AERO nasal inhaler Place 2 sprays into the nose daily.   . [DISCONTINUED] amoxicillin-clavulanate (AUGMENTIN) 875-125 MG tablet Take 1 tablet by mouth 2 (two) times daily. (Patient not taking: Reported on 05/13/2019)   No facility-administered medications prior to visit.    Review of Systems  Constitutional: Negative.   HENT: Negative.   Eyes: Negative.   Respiratory: Negative.   Cardiovascular: Negative.   Gastrointestinal: Negative.   Endocrine: Negative.   Genitourinary:  Negative.   Musculoskeletal: Negative.   Skin: Negative.   Allergic/Immunologic: Positive for environmental allergies.  Neurological: Negative.   Hematological: Negative.   Psychiatric/Behavioral: Negative.     Last CBC Lab Results  Component Value Date   WBC 5.0 05/13/2019   HGB 13.3 05/13/2019   HCT 39.3 05/13/2019   MCV 96 05/13/2019   MCH 32.6 05/13/2019   RDW 12.2 05/13/2019   PLT 247 62/13/0865   Last metabolic panel Lab Results  Component Value Date   GLUCOSE 93 05/13/2019   NA 131 (L) 05/13/2019   K 4.6 05/13/2019   CL 92 (L) 05/13/2019   CO2 23 05/13/2019   BUN 15 05/13/2019   CREATININE 0.99 05/13/2019   GFRNONAA 54 (L) 05/13/2019   GFRAA 62 05/13/2019   CALCIUM 9.7 05/13/2019   PROT 6.9 05/13/2019   ALBUMIN 5.0 (H) 05/13/2019   LABGLOB 1.9 05/13/2019   AGRATIO 2.6 (H) 05/13/2019   BILITOT 0.4 05/13/2019   ALKPHOS 95 05/13/2019   AST 20 05/13/2019   ALT 12 05/13/2019   Last lipids Lab Results  Component Value Date   CHOL 222 (H) 05/13/2019   HDL 80 05/13/2019   LDLCALC 132 (H) 05/13/2019   TRIG 57 05/13/2019   CHOLHDL 2.8 05/13/2019   Last hemoglobin A1c No results found for: HGBA1C Last thyroid functions No results found for: TSH, T3TOTAL, T4TOTAL, THYROIDAB Last vitamin D No results found for:  25OHVITD2, 25OHVITD3, VD25OH Last vitamin B12 and Folate No results found for: VITAMINB12, FOLATE    Objective    BP 136/70 (BP Location: Left Arm, Patient Position: Sitting, Cuff Size: Normal)   Pulse 68   Temp 98.6 F (37 C) (Oral)   Resp 16   Ht 5\' 4"  (1.626 m)   Wt 197 lb (89.4 kg)   BMI 33.81 kg/m  BP Readings from Last 3 Encounters:  05/18/20 136/70  05/18/20 (!) 146/80  05/13/19 132/72   Wt Readings from Last 3 Encounters:  05/18/20 197 lb (89.4 kg)  05/18/20 197 lb 9.6 oz (89.6 kg)  05/13/19 197 lb (89.4 kg)      Physical Exam Exam conducted with a chaperone present.  Constitutional:      Appearance: Normal appearance. She is well-developed.  HENT:     Head: Normocephalic and atraumatic.     Right Ear: Tympanic membrane, ear canal and external ear normal.     Left Ear: Tympanic membrane, ear canal and external ear normal.     Nose: Nose normal.     Mouth/Throat:     Mouth: Mucous membranes are dry.     Pharynx: Uvula midline.  Eyes:     General: Lids are normal.     Conjunctiva/sclera: Conjunctivae normal.     Pupils: Pupils are equal, round, and reactive to light.  Neck:     Thyroid: No thyroid mass or thyromegaly.     Vascular: No carotid bruit.     Trachea: Trachea normal.  Cardiovascular:     Rate and Rhythm: Normal rate and regular rhythm.     Heart sounds: Normal heart sounds.  Pulmonary:     Effort: Pulmonary effort is normal.     Breath sounds: Normal breath sounds.  Chest:     Comments: Breasts: breasts appear normal, no suspicious masses, no skin or nipple changes or axillary nodes.  Abdominal:     General: Bowel sounds are normal.     Palpations: Abdomen is soft.     Tenderness: There is no  abdominal tenderness.  Musculoskeletal:        General: Normal range of motion.     Cervical back: Normal range of motion and neck supple.  Lymphadenopathy:     Cervical: No cervical adenopathy.  Skin:    General: Skin is warm and dry.    Neurological:     General: No focal deficit present.     Mental Status: She is alert and oriented to person, place, and time.     Cranial Nerves: No cranial nerve deficit.  Psychiatric:        Mood and Affect: Mood normal.        Speech: Speech normal.        Behavior: Behavior normal.        Thought Content: Thought content normal.        Judgment: Judgment normal.       Last depression screening scores PHQ 2/9 Scores 05/18/2020 05/13/2019 06/05/2018  PHQ - 2 Score 0 0 0  PHQ- 9 Score - - -   Last fall risk screening Fall Risk  05/18/2020  Falls in the past year? 0  Number falls in past yr: 0  Injury with Fall? 0   Last Audit-C alcohol use screening Alcohol Use Disorder Test (AUDIT) 05/18/2020  1. How often do you have a drink containing alcohol? 1  2. How many drinks containing alcohol do you have on a typical day when you are drinking? 0  3. How often do you have six or more drinks on one occasion? 0  AUDIT-C Score 1  Alcohol Brief Interventions/Follow-up AUDIT Score <7 follow-up not indicated   A score of 3 or more in women, and 4 or more in men indicates increased risk for alcohol abuse, EXCEPT if all of the points are from question 1   No results found for any visits on 05/18/20.  Assessment & Plan    Routine Health Maintenance and Physical Exam  Exercise Activities and Dietary recommendations Goals    . Weight (lb) < 180 lb (81.6 kg)     Recommend to continue current diet plan of eating 3 small meals a day with 2 healthy snacks and increase daily water intake to help aid in weight loss.        Immunization History  Administered Date(s) Administered  . Influenza, High Dose Seasonal PF 04/08/2015, 04/17/2016, 04/01/2017, 03/25/2018, 04/15/2019, 04/30/2020  . PFIZER SARS-COV-2 Vaccination 07/22/2019, 08/05/2019, 04/01/2020  . Pneumococcal Conjugate-13 01/20/2014  . Pneumococcal Polysaccharide-23 04/17/2016  . Td 09/21/2004  . Tdap 02/27/2011  . Zoster  04/08/2006  . Zoster Recombinat (Shingrix) 07/08/2017, 09/09/2017    Health Maintenance  Topic Date Due  . DEXA SCAN  04/19/2020  . TETANUS/TDAP  02/26/2021  . INFLUENZA VACCINE  Completed  . COVID-19 Vaccine  Completed  . PNA vac Low Risk Adult  Completed    Discussed health benefits of physical activity, and encouraged her to engage in regular exercise appropriate for her age and condition.  1. Annual physical exam   2. Benign hypertension  - CBC w/Diff/Platelet  3. Hypercholesterolemia  - Lipid panel - Comprehensive Metabolic Panel (CMET)  4. Hypothyroidism, unspecified type   5. Class 1 obesity due to excess calories with serious comorbidity and body mass index (BMI) of 32.0 to 32.9 in adult  6.  Macular degeneration  7.  Bladder prolapse Pelvis exam next year.   No follow-ups on file.        Mallory Sanders Mon, MD  [facility]  Practice [phone] (phone) [phone] (fax)  [place]

## 2020-05-18 NOTE — Patient Instructions (Signed)
Preventive Care 38 Years and Older, Female Preventive care refers to lifestyle choices and visits with your health care provider that can promote health and wellness. This includes:  A yearly physical exam. This is also called an annual well check.  Regular dental and eye exams.  Immunizations.  Screening for certain conditions.  Healthy lifestyle choices, such as diet and exercise. What can I expect for my preventive care visit? Physical exam Your health care provider will check:  Height and weight. These may be used to calculate body mass index (BMI), which is a measurement that tells if you are at a healthy weight.  Heart rate and blood pressure.  Your skin for abnormal spots. Counseling Your health care provider may ask you questions about:  Alcohol, tobacco, and drug use.  Emotional well-being.  Home and relationship well-being.  Sexual activity.  Eating habits.  History of falls.  Memory and ability to understand (cognition).  Work and work Statistician.  Pregnancy and menstrual history. What immunizations do I need?  Influenza (flu) vaccine  This is recommended every year. Tetanus, diphtheria, and pertussis (Tdap) vaccine  You may need a Td booster every 10 years. Varicella (chickenpox) vaccine  You may need this vaccine if you have not already been vaccinated. Zoster (shingles) vaccine  You may need this after age 33. Pneumococcal conjugate (PCV13) vaccine  One dose is recommended after age 33. Pneumococcal polysaccharide (PPSV23) vaccine  One dose is recommended after age 72. Measles, mumps, and rubella (MMR) vaccine  You may need at least one dose of MMR if you were born in 1957 or later. You may also need a second dose. Meningococcal conjugate (MenACWY) vaccine  You may need this if you have certain conditions. Hepatitis A vaccine  You may need this if you have certain conditions or if you travel or work in places where you may be exposed  to hepatitis A. Hepatitis B vaccine  You may need this if you have certain conditions or if you travel or work in places where you may be exposed to hepatitis B. Haemophilus influenzae type b (Hib) vaccine  You may need this if you have certain conditions. You may receive vaccines as individual doses or as more than one vaccine together in one shot (combination vaccines). Talk with your health care provider about the risks and benefits of combination vaccines. What tests do I need? Blood tests  Lipid and cholesterol levels. These may be checked every 5 years, or more frequently depending on your overall health.  Hepatitis C test.  Hepatitis B test. Screening  Lung cancer screening. You may have this screening every year starting at age 39 if you have a 30-pack-year history of smoking and currently smoke or have quit within the past 15 years.  Colorectal cancer screening. All adults should have this screening starting at age 36 and continuing until age 15. Your health care provider may recommend screening at age 23 if you are at increased risk. You will have tests every 1-10 years, depending on your results and the type of screening test.  Diabetes screening. This is done by checking your blood sugar (glucose) after you have not eaten for a while (fasting). You may have this done every 1-3 years.  Mammogram. This may be done every 1-2 years. Talk with your health care provider about how often you should have regular mammograms.  BRCA-related cancer screening. This may be done if you have a family history of breast, ovarian, tubal, or peritoneal cancers.  Other tests  Sexually transmitted disease (STD) testing.  Bone density scan. This is done to screen for osteoporosis. You may have this done starting at age 44. Follow these instructions at home: Eating and drinking  Eat a diet that includes fresh fruits and vegetables, whole grains, lean protein, and low-fat dairy products. Limit  your intake of foods with high amounts of sugar, saturated fats, and salt.  Take vitamin and mineral supplements as recommended by your health care provider.  Do not drink alcohol if your health care provider tells you not to drink.  If you drink alcohol: ? Limit how much you have to 0-1 drink a day. ? Be aware of how much alcohol is in your drink. In the [place] one drink equals one 12 oz bottle of beer (355 mL), one 5 oz glass of wine (148 mL), or one 1 oz glass of hard liquor (44 mL). Lifestyle  Take daily care of your teeth and gums.  Stay active. Exercise for at least 30 minutes on 5 or more days each week.  Do not use any products that contain nicotine or tobacco, such as cigarettes, e-cigarettes, and chewing tobacco. If you need help quitting, ask your health care provider.  If you are sexually active, practice safe sex. Use a condom or other form of protection in order to prevent STIs (sexually transmitted infections).  Talk with your health care provider about taking a low-dose aspirin or statin. What's next?  Go to your health care provider once a year for a well check visit.  Ask your health care provider how often you should have your eyes and teeth checked.  Stay up to date on all vaccines. This information is not intended to replace advice given to you by your health care provider. Make sure you discuss any questions you have with your health care provider. Document Revised: 06/12/2018 Document Reviewed: 06/12/2018 Elsevier Patient Education  2020 [employer].

## 2020-05-19 LAB — CBC WITH DIFFERENTIAL/PLATELET
Basophils Absolute: 0.1 10*3/uL (ref 0.0–0.2)
Basos: 1 %
EOS (ABSOLUTE): 0.1 10*3/uL (ref 0.0–0.4)
Eos: 1 %
Hematocrit: 41.9 % (ref 34.0–46.6)
Hemoglobin: 13.9 g/dL (ref 11.1–15.9)
Immature Grans (Abs): 0 10*3/uL (ref 0.0–0.1)
Immature Granulocytes: 0 %
Lymphocytes Absolute: 1.3 10*3/uL (ref 0.7–3.1)
Lymphs: 25 %
MCH: 31.3 pg (ref 26.6–33.0)
MCHC: 33.2 g/dL (ref 31.5–35.7)
MCV: 94 fL (ref 79–97)
Monocytes Absolute: 0.6 10*3/uL (ref 0.1–0.9)
Monocytes: 11 %
Neutrophils Absolute: 3.3 10*3/uL (ref 1.4–7.0)
Neutrophils: 62 %
Platelets: 252 10*3/uL (ref 150–450)
RBC: 4.44 x10E6/uL (ref 3.77–5.28)
RDW: 12.3 % (ref 11.7–15.4)
WBC: 5.3 10*3/uL (ref 3.4–10.8)

## 2020-05-19 LAB — COMPREHENSIVE METABOLIC PANEL
ALT: 10 IU/L (ref 0–32)
AST: 22 IU/L (ref 0–40)
Albumin/Globulin Ratio: 2.3 — ABNORMAL HIGH (ref 1.2–2.2)
Albumin: 4.9 g/dL — ABNORMAL HIGH (ref 3.6–4.6)
Alkaline Phosphatase: 97 IU/L (ref 44–121)
BUN/Creatinine Ratio: 18 (ref 12–28)
BUN: 17 mg/dL (ref 8–27)
Bilirubin Total: 0.4 mg/dL (ref 0.0–1.2)
CO2: 22 mmol/L (ref 20–29)
Calcium: 9.8 mg/dL (ref 8.7–10.3)
Chloride: 92 mmol/L — ABNORMAL LOW (ref 96–106)
Creatinine, Ser: 0.96 mg/dL (ref 0.57–1.00)
GFR calc Af Amer: 64 mL/min/{1.73_m2} (ref >59)
GFR calc non Af Amer: 56 mL/min/{1.73_m2} — ABNORMAL LOW (ref >59)
Globulin, Total: 2.1 g/dL (ref 1.5–4.5)
Glucose: 96 mg/dL (ref 65–99)
Potassium: 4.9 mmol/L (ref 3.5–5.2)
Sodium: 130 mmol/L — ABNORMAL LOW (ref 134–144)
Total Protein: 7 g/dL (ref 6.0–8.5)

## 2020-05-19 LAB — LIPID PANEL
Chol/HDL Ratio: 2.7 ratio (ref 0.0–4.4)
Cholesterol, Total: 264 mg/dL — ABNORMAL HIGH (ref 100–199)
HDL: 98 mg/dL (ref >39)
LDL Chol Calc (NIH): 158 mg/dL — ABNORMAL HIGH (ref 0–99)
Triglycerides: 53 mg/dL (ref 0–149)
VLDL Cholesterol Cal: 8 mg/dL (ref 5–40)

## 2020-05-19 LAB — TSH: TSH: 1.88 u[IU]/mL (ref 0.450–4.500)

## 2020-06-02 ENCOUNTER — Other Ambulatory Visit: Payer: Self-pay | Admitting: Family Medicine

## 2020-06-02 DIAGNOSIS — I10 Essential (primary) hypertension: Secondary | ICD-10-CM

## 2020-06-02 DIAGNOSIS — Z0000 Encounter for general adult medical examination without abnormal findings: Secondary | ICD-10-CM

## 2020-06-02 DIAGNOSIS — K219 Gastro-esophageal reflux disease without esophagitis: Secondary | ICD-10-CM

## 2020-06-02 NOTE — Telephone Encounter (Signed)
Requested Prescriptions  Pending Prescriptions Disp Refills   lisinopril (ZESTRIL) 10 MG tablet [Pharmacy Med Name: LISINOPRIL 10 MG TAB] 90 tablet 1    Sig: TAKE ONE TABLET EVERY DAY     Cardiovascular:  ACE Inhibitors Passed - 06/02/2020 11:10 AM      Passed - Cr in normal range and within 180 days    Creat  Date Value Ref Range Status  04/24/2017 0.96 (H) 0.60 - 0.93 mg/dL Final    Comment:    For patients >81 years of age, the reference limit for Creatinine is approximately 13% higher for people identified as African-American. .    Creatinine, Ser  Date Value Ref Range Status  05/18/2020 0.96 0.57 - 1.00 mg/dL Final         Passed - K in normal range and within 180 days    Potassium  Date Value Ref Range Status  05/18/2020 4.9 3.5 - 5.2 mmol/L Final  03/16/2013 4.7 3.5 - 5.1 mmol/L Final         Passed - Patient is not pregnant      Passed - Last BP in normal range    BP Readings from Last 1 Encounters:  05/18/20 136/70         Passed - Valid encounter within last 6 months    Recent Outpatient Visits          2 weeks ago Annual physical exam   [facility] Jerrol Banana., MD   1 year ago Annual physical exam   [facility] Jerrol Banana., MD   1 year ago Benign hypertension   [facility] Jerrol Banana., MD   1 year ago URI, acute   [facility] [place], [place], [place]   2 years ago Annual physical exam   [facility] Jerrol Banana., MD      Future Appointments            In 12 months Jerrol Banana., MD [facility]            lansoprazole (PREVACID) 30 MG capsule [Pharmacy Med Name: LANSOPRAZOLE 30 MG CAP] 90 capsule 3    Sig: TAKE [place]     Gastroenterology: Proton Pump Inhibitors Passed - 06/02/2020 11:10 AM      Passed - Valid encounter within last 12 months    Recent Outpatient Visits          2 weeks  ago Annual physical exam   [facility] Jerrol Banana., MD   1 year ago Annual physical exam   [facility] Jerrol Banana., MD   1 year ago Benign hypertension   [facility] Jerrol Banana., MD   1 year ago URI, acute   [facility] [place], [place], [place]   2 years ago Annual physical exam   [facility] Jerrol Banana., MD      Future Appointments            In 12 months Jerrol Banana., MD [facility], [place]

## 2020-06-07 ENCOUNTER — Other Ambulatory Visit: Payer: Self-pay | Admitting: Family Medicine

## 2020-06-07 DIAGNOSIS — Z0000 Encounter for general adult medical examination without abnormal findings: Secondary | ICD-10-CM

## 2020-06-07 DIAGNOSIS — I10 Essential (primary) hypertension: Secondary | ICD-10-CM

## 2020-08-17 ENCOUNTER — Other Ambulatory Visit: Payer: Self-pay

## 2020-08-17 ENCOUNTER — Ambulatory Visit
Admission: RE | Admit: 2020-08-17 | Discharge: 2020-08-17 | Disposition: A | Discharge status: Home / Self Care | Payer: Medicare PPO | Source: Ambulatory Visit | Attending: Family Medicine | Admitting: Family Medicine

## 2020-08-17 DIAGNOSIS — E2839 Other primary ovarian failure: Secondary | ICD-10-CM | POA: Diagnosis not present

## 2020-12-02 ENCOUNTER — Other Ambulatory Visit: Payer: Self-pay | Admitting: Family Medicine

## 2020-12-02 DIAGNOSIS — Z0000 Encounter for general adult medical examination without abnormal findings: Secondary | ICD-10-CM

## 2020-12-02 DIAGNOSIS — I10 Essential (primary) hypertension: Secondary | ICD-10-CM

## 2020-12-02 NOTE — Telephone Encounter (Signed)
Courtesy refill. Future appt in 5 months

## 2021-02-27 ENCOUNTER — Other Ambulatory Visit: Payer: Self-pay | Admitting: Family Medicine

## 2021-02-27 DIAGNOSIS — Z1231 Encounter for screening mammogram for malignant neoplasm of breast: Secondary | ICD-10-CM

## 2021-03-01 ENCOUNTER — Other Ambulatory Visit: Payer: Self-pay | Admitting: Family Medicine

## 2021-03-01 DIAGNOSIS — I10 Essential (primary) hypertension: Secondary | ICD-10-CM

## 2021-03-01 DIAGNOSIS — Z0000 Encounter for general adult medical examination without abnormal findings: Secondary | ICD-10-CM

## 2021-03-01 NOTE — Telephone Encounter (Signed)
Attempted to call patient to schedule follow up appointment- "call can not be completed at this time" message- unable to leave call back message. Note to call for appointment on RF- courtesy 30 day.

## 2021-03-02 ENCOUNTER — Other Ambulatory Visit: Payer: Self-pay | Admitting: Family Medicine

## 2021-03-02 DIAGNOSIS — K219 Gastro-esophageal reflux disease without esophagitis: Secondary | ICD-10-CM

## 2021-03-02 DIAGNOSIS — Z0000 Encounter for general adult medical examination without abnormal findings: Secondary | ICD-10-CM

## 2021-03-06 ENCOUNTER — Other Ambulatory Visit: Payer: Self-pay | Admitting: Family Medicine

## 2021-03-06 DIAGNOSIS — Z0000 Encounter for general adult medical examination without abnormal findings: Secondary | ICD-10-CM

## 2021-03-06 DIAGNOSIS — I10 Essential (primary) hypertension: Secondary | ICD-10-CM

## 2021-03-07 ENCOUNTER — Other Ambulatory Visit: Payer: Self-pay | Admitting: Family Medicine

## 2021-03-07 DIAGNOSIS — Z0000 Encounter for general adult medical examination without abnormal findings: Secondary | ICD-10-CM

## 2021-03-07 DIAGNOSIS — I10 Essential (primary) hypertension: Secondary | ICD-10-CM

## 2021-03-07 NOTE — Telephone Encounter (Signed)
Pt called stating that she will be out of this medication tomorrow. She states that it is important that she have this to take with her lisinopril. Please advise.

## 2021-03-07 NOTE — Telephone Encounter (Signed)
Medication sent in per Dr Rosanna Randy.

## 2021-03-20 ENCOUNTER — Ambulatory Visit
Admission: RE | Admit: 2021-03-20 | Discharge: 2021-03-20 | Disposition: A | Discharge status: Home / Self Care | Payer: Medicare PPO | Source: Ambulatory Visit | Attending: Family Medicine | Admitting: Family Medicine

## 2021-03-20 ENCOUNTER — Other Ambulatory Visit: Payer: Self-pay

## 2021-03-20 DIAGNOSIS — Z1231 Encounter for screening mammogram for malignant neoplasm of breast: Secondary | ICD-10-CM | POA: Insufficient documentation

## 2021-05-30 ENCOUNTER — Encounter: Payer: Self-pay | Admitting: Family Medicine

## 2021-05-30 ENCOUNTER — Ambulatory Visit: Payer: Self-pay

## 2021-05-30 ENCOUNTER — Ambulatory Visit (INDEPENDENT_AMBULATORY_CARE_PROVIDER_SITE_OTHER): Payer: Medicare PPO | Admitting: Family Medicine

## 2021-05-30 ENCOUNTER — Ambulatory Visit (INDEPENDENT_AMBULATORY_CARE_PROVIDER_SITE_OTHER): Payer: Medicare PPO

## 2021-05-30 ENCOUNTER — Other Ambulatory Visit: Payer: Self-pay

## 2021-05-30 VITALS — BP 150/90 | HR 76 | Temp 97.9°F | Ht 64.0 in | Wt 195.9 lb

## 2021-05-30 VITALS — BP 155/73 | HR 63

## 2021-05-30 DIAGNOSIS — E6609 Other obesity due to excess calories: Secondary | ICD-10-CM

## 2021-05-30 DIAGNOSIS — E66811 Obesity, class 1: Secondary | ICD-10-CM

## 2021-05-30 DIAGNOSIS — Z0000 Encounter for general adult medical examination without abnormal findings: Secondary | ICD-10-CM

## 2021-05-30 DIAGNOSIS — E039 Hypothyroidism, unspecified: Secondary | ICD-10-CM | POA: Diagnosis not present

## 2021-05-30 DIAGNOSIS — E7800 Pure hypercholesterolemia, unspecified: Secondary | ICD-10-CM | POA: Diagnosis not present

## 2021-05-30 DIAGNOSIS — E2839 Other primary ovarian failure: Secondary | ICD-10-CM

## 2021-05-30 DIAGNOSIS — I10 Essential (primary) hypertension: Secondary | ICD-10-CM | POA: Diagnosis not present

## 2021-05-30 DIAGNOSIS — Z6832 Body mass index (BMI) 32.0-32.9, adult: Secondary | ICD-10-CM

## 2021-05-30 MED ORDER — LISINOPRIL 20 MG PO TABS: 20.0000 mg | ORAL_TABLET | Freq: Every day | ORAL | 1 refills | Status: DC

## 2021-05-30 NOTE — Patient Instructions (Signed)
Mallory Sanders , Thank you for taking time to come for your Medicare Wellness Visit. I appreciate your ongoing commitment to your health goals. Please review the following plan we discussed and let me know if I can assist you in the future.   Screening recommendations/referrals: Colonoscopy: 02/01/15 Mammogram: 03/20/21 Bone Density: 08/17/20 Recommended yearly ophthalmology/optometry visit for glaucoma screening and checkup Recommended yearly dental visit for hygiene and checkup  Vaccinations: Influenza vaccine: 04/08/21 Pneumococcal vaccine: 04/17/16 Tdap vaccine: 02/27/11 Shingles vaccine: 07/08/17, 09/09/17   Covid-19:07/22/19, 08/05/19, 04/01/20  Advanced directives: yes, copy requested  Conditions/risks identified:   Next appointment: Follow up in one year for your annual wellness visit    Preventive Care 55 Years and Older, Female Preventive care refers to lifestyle choices and visits with your health care provider that can promote health and wellness. What does preventive care include? A yearly physical exam. This is also called an annual well check. Dental exams once or twice a year. Routine eye exams. Ask your health care provider how often you should have your eyes checked. Personal lifestyle choices, including: Daily care of your teeth and gums. Regular physical activity. Eating a healthy diet. Avoiding tobacco and drug use. Limiting alcohol use. Practicing safe sex. Taking low-dose aspirin every day. Taking vitamin and mineral supplements as recommended by your health care provider. What happens during an annual well check? The services and screenings done by your health care provider during your annual well check will depend on your age, overall health, lifestyle risk factors, and family history of disease. Counseling  Your health care provider may ask you questions about your: Alcohol use. Tobacco use. Drug use. Emotional well-being. Home and relationship  well-being. Sexual activity. Eating habits. History of falls. Memory and ability to understand (cognition). Work and work Statistician. Reproductive health. Screening  You may have the following tests or measurements: Height, weight, and BMI. Blood pressure. Lipid and cholesterol levels. These may be checked every 5 years, or more frequently if you are over 79 years old. Skin check. Lung cancer screening. You may have this screening every year starting at age 92 if you have a 30-pack-year history of smoking and currently smoke or have quit within the past 15 years. Fecal occult blood test (FOBT) of the stool. You may have this test every year starting at age 50. Flexible sigmoidoscopy or colonoscopy. You may have a sigmoidoscopy every 5 years or a colonoscopy every 10 years starting at age 19. Hepatitis C blood test. Hepatitis B blood test. Sexually transmitted disease (STD) testing. Diabetes screening. This is done by checking your blood sugar (glucose) after you have not eaten for a while (fasting). You may have this done every 1-3 years. Bone density scan. This is done to screen for osteoporosis. You may have this done starting at age 21. Mammogram. This may be done every 1-2 years. Talk to your health care provider about how often you should have regular mammograms. Talk with your health care provider about your test results, treatment options, and if necessary, the need for more tests. Vaccines  Your health care provider may recommend certain vaccines, such as: Influenza vaccine. This is recommended every year. Tetanus, diphtheria, and acellular pertussis (Tdap, Td) vaccine. You may need a Td booster every 10 years. Zoster vaccine. You may need this after age 19. Pneumococcal 13-valent conjugate (PCV13) vaccine. One dose is recommended after age 59. Pneumococcal polysaccharide (PPSV23) vaccine. One dose is recommended after age 22. Talk to your health care provider about  which  screenings and vaccines you need and how often you need them. This information is not intended to replace advice given to you by your health care provider. Make sure you discuss any questions you have with your health care provider. Document Released: 07/15/2015 Document Revised: 03/07/2016 Document Reviewed: 04/19/2015 Elsevier Interactive Patient Education  2017 [place] Prevention in the Home Falls can cause injuries. They can happen to people of all ages. There are many things you can do to make your home safe and to help prevent falls. What can I do on the outside of my home? Regularly fix the edges of walkways and driveways and fix any cracks. Remove anything that might make you trip as you walk through a door, such as a raised step or threshold. Trim any bushes or trees on the path to your home. Use bright outdoor lighting. Clear any walking paths of anything that might make someone trip, such as rocks or tools. Regularly check to see if handrails are loose or broken. Make sure that both sides of any steps have handrails. Any raised decks and porches should have guardrails on the edges. Have any leaves, snow, or ice cleared regularly. Use sand or salt on walking paths during winter. Clean up any spills in your garage right away. This includes oil or grease spills. What can I do in the bathroom? Use night lights. Install grab bars by the toilet and in the tub and shower. Do not use towel bars as grab bars. Use non-skid mats or decals in the tub or shower. If you need to sit down in the shower, use a plastic, non-slip stool. Keep the floor dry. Clean up any water that spills on the floor as soon as it happens. Remove soap buildup in the tub or shower regularly. Attach bath mats securely with double-sided non-slip rug tape. Do not have throw rugs and other things on the floor that can make you trip. What can I do in the bedroom? Use night lights. Make sure that you have a  light by your bed that is easy to reach. Do not use any sheets or blankets that are too big for your bed. They should not hang down onto the floor. Have a firm chair that has side arms. You can use this for support while you get dressed. Do not have throw rugs and other things on the floor that can make you trip. What can I do in the kitchen? Clean up any spills right away. Avoid walking on wet floors. Keep items that you use a lot in easy-to-reach places. If you need to reach something above you, use a strong step stool that has a grab bar. Keep electrical cords out of the way. Do not use floor polish or wax that makes floors slippery. If you must use wax, use non-skid floor wax. Do not have throw rugs and other things on the floor that can make you trip. What can I do with my stairs? Do not leave any items on the stairs. Make sure that there are handrails on both sides of the stairs and use them. Fix handrails that are broken or loose. Make sure that handrails are as long as the stairways. Check any carpeting to make sure that it is firmly attached to the stairs. Fix any carpet that is loose or worn. Avoid having throw rugs at the top or bottom of the stairs. If you do have throw rugs, attach them to the floor with  carpet tape. Make sure that you have a light switch at the top of the stairs and the bottom of the stairs. If you do not have them, ask someone to add them for you. What else can I do to help prevent falls? Wear shoes that: Do not have high heels. Have rubber bottoms. Are comfortable and fit you well. Are closed at the toe. Do not wear sandals. If you use a stepladder: Make sure that it is fully opened. Do not climb a closed stepladder. Make sure that both sides of the stepladder are locked into place. Ask someone to hold it for you, if possible. Clearly mark and make sure that you can see: Any grab bars or handrails. First and last steps. Where the edge of each step  is. Use tools that help you move around (mobility aids) if they are needed. These include: Canes. Walkers. Scooters. Crutches. Turn on the lights when you go into a dark area. Replace any light bulbs as soon as they burn out. Set up your furniture so you have a clear path. Avoid moving your furniture around. If any of your floors are uneven, fix them. If there are any pets around you, be aware of where they are. Review your medicines with your doctor. Some medicines can make you feel dizzy. This can increase your chance of falling. Ask your doctor what other things that you can do to help prevent falls. This information is not intended to replace advice given to you by your health care provider. Make sure you discuss any questions you have with your health care provider. Document Released: 04/14/2009 Document Revised: 11/24/2015 Document Reviewed: 07/23/2014 Elsevier Interactive Patient Education  2017 [employer].

## 2021-05-30 NOTE — Telephone Encounter (Signed)
Patient called regarding medications.   Patient wants too be sure that she should be taking 20 mg of lisinopril and the 12.5 HCTZ.  Please call pt back or send a my chart message.

## 2021-05-30 NOTE — Progress Notes (Signed)
Subjective:   Mallory Sanders is a 80 y.o. female who presents for Medicare Annual (Subsequent) preventive examination.  Review of Systems     Cardiac Risk Factors include: advanced age (>28men, >61 women);sedentary lifestyle;hypertension     Objective:    Today's Vitals   05/30/21 0901  BP: (!) 150/90  Pulse: 76  Temp: 97.9 F (36.6 C)  TempSrc: Oral  SpO2: 100%  Weight: 195 lb 14.4 oz (88.9 kg)  Height: 5\' 4"  (1.626 m)   Body mass index is 33.63 kg/m.  Advanced Directives 05/30/2021 05/18/2020 05/13/2019 05/08/2018 04/24/2017 04/17/2016 04/08/2015  Does Patient Have a Medical Advance Directive? Yes Yes Yes Yes Yes Yes Yes  Type of Advance Directive Living will [place];Living will [place];Living will Living will;Healthcare Power of [place];Living will [place];Living will [place];Living will  Does patient want to make changes to medical advance directive? Yes (Inpatient - patient defers changing a medical advance directive at this time - Information given) - - - - - -  Copy of [place] in Chart? - No - copy requested No - copy requested No - copy requested No - copy requested - -    Current Medications (verified) Outpatient Encounter Medications as of 05/30/2021  Medication Sig   aspirin 81 MG tablet Take 81 mg by mouth daily.    Calcium Carbonate-Vitamin D (CALCIUM-VITAMIN D) 500-200 MG-UNIT per tablet Take 1 tablet by mouth 3 (three) times daily.     dimenhyDRINATE (DRAMAMINE) 50 MG tablet Take 50 mg by mouth as needed.   fexofenadine (ALLEGRA) 180 MG tablet Take 180 mg by mouth daily.    hydrochlorothiazide (HYDRODIURIL) 12.5 MG tablet TAKE ONE TABLET EVERY DAY   Ibuprofen 200 MG CAPS Take 2 capsules by mouth as needed.   lansoprazole (PREVACID) 30 MG capsule TAKE 1 CAPSULE EVERY DAY   levothyroxine (SYNTHROID, LEVOTHROID) 100 MCG tablet  Take 1 tablet (100 mcg total) by mouth daily before breakfast.   lisinopril (ZESTRIL) 10 MG tablet TAKE ONE TABLET EVERY DAY *NEED TO SCHEDULE FOR OFFICE USE VISIT*   Multiple Vitamins-Minerals (PRESERVISION AREDS 2 PO) Take 1 tablet by mouth 2 (two) times daily.   Nutritional Supplements (JUICE PLUS FIBRE PO) Take 2 capsules by mouth 2 (two) times daily.    triamcinolone (NASACORT) 55 MCG/ACT AERO nasal inhaler Place 2 sprays into the nose daily.    meclizine (ANTIVERT) 25 MG tablet Take 25 mg by mouth 3 (three) times daily as needed for dizziness.   No facility-administered encounter medications on file as of 05/30/2021.    Allergies (verified) Propoxyphene n-acetaminophen, Streptomycin, Tramadol hcl, and Ultra antioxidant formula [anti-oxidant]   History: Past Medical History:  Diagnosis Date   Cancer ([place])    skin cancer on left side of face   Gallstone    Heart murmur 1999   Hemorrhoids    Hepatitis A 1962   Hypertension    Hypothyroidism    s/p thyroidectomy   S/P repair of ventral hernia 09/25/2003   Lower abdominal incarcerated ventral hernia repaired with Kugel patch   Past Surgical History:  Procedure Laterality Date   [place]  2005   Dr. Bary Castilla   COLONOSCOPY WITH PROPOFOL N/A 02/01/2015   Procedure: COLONOSCOPY WITH PROPOFOL;  Surgeon: Robert Bellow, MD;  Location: [facility] ENDOSCOPY;  Service: Endoscopy;  Laterality: N/A;  HERNIA REPAIR  2005   Lower abdominal incarcerated ventral hernia repaired with Kugel patch   HERNIA REPAIR  02/28/55   umbilical hernia repair   REPLACEMENT TOTAL KNEE  04/26/10   right   SKIN CANCER EXCISION  12/2014   THYROIDECTOMY  08/2005   TOTAL KNEE ARTHROPLASTY  04/13/2009   [facility], left knee   Family History  Problem Relation Age of Onset   Cancer Mother    Breast cancer Mother 74   Leukemia Mother    Stroke Father    Breast cancer Maternal Aunt    Colon cancer Maternal  Uncle    Breast cancer Cousin    Breast cancer Cousin    Coronary artery disease Neg Hx    Social History   Socioeconomic History   Marital status: Widowed    Spouse name: Not on file   Number of children: 1   Years of education: Master's   Highest education level: Master's degree (e.g., MA, MS, MEng, MEd, MSW, MBA)  Occupational History   Occupation: Retired, Pharmacist, hospital  Tobacco Use   Smoking status: Former    Packs/day: 0.50    Types: Cigarettes    Quit date: 07/02/1985    Years since quitting: 35.9   Smokeless tobacco: Never  Vaping Use   Vaping Use: Never used  Substance and Sexual Activity   Alcohol use: Yes    Comment: seldom 1 glass of wine   Drug use: No   Sexual activity: Not on file  Other Topics Concern   Not on file  Social History Narrative   Widowed   Does not get regular exercise   Social Determinants of Health   Financial Resource Strain: Low Risk    Difficulty of Paying Living Expenses: Not hard at all  Food Insecurity: No Food Insecurity   Worried About Charity fundraiser in the Last Year: Never true   [place] in the Last Year: Never true  Transportation Needs: No Transportation Needs   Lack of Transportation (Medical): No   Lack of Transportation (Non-Medical): No  Physical Activity: Insufficiently Active   Days of Exercise per Week: 1 day   Minutes of Exercise per Session: 20 min  Stress: No Stress Concern Present   Feeling of Stress : Not at all  Social Connections: Moderately Isolated   Frequency of Communication with Friends and Family: More than three times a week   Frequency of Social Gatherings with Friends and Family: More than three times a week   Attends Religious Services: Never   Marine scientist or Organizations: Yes   Attends Archivist Meetings: 1 to 4 times per year   Marital Status: Widowed    Tobacco Counseling Counseling given: Not Answered   Clinical Intake:     Pain : No/denies pain      Nutritional Status: BMI > 30  Obese Nutritional Risks: None Diabetes: No    Interpreter Needed?: No  Information entered by :: Kirke Shaggy, LPN   Activities of Daily Living In your present state of health, do you have any difficulty performing the following activities: 05/30/2021  Hearing? Y  Vision? N  Difficulty concentrating or making decisions? N  Walking or climbing stairs? N  Dressing or bathing? N  Doing errands, shopping? N  Preparing Food and eating ? N  Using the Toilet? N  In the past six months, have you accidently leaked urine? N  Do you have problems with loss of bowel  control? N  Managing your Medications? N  Managing your Finances? N  Housekeeping or managing your Housekeeping? N  Some recent data might be hidden    Patient Care Team: Jerrol Banana., MD as PCP - General (Family Medicine) Bary Castilla, Forest Gleason, MD as Consulting Physician (General Surgery) Eulogio Bear, MD as Consulting Physician (Ophthalmology) Margaretha Sheffield, MD as Consulting Physician (Otolaryngology) Oneta Rack, MD as Consulting Physician (Dermatology)  Indicate any recent Medical Services you may have received from other than [facility] providers in the past year (date may be approximate).     Assessment:   This is a routine wellness examination for [place].  Hearing/Vision screen No results found.  Dietary issues and exercise activities discussed: Current Exercise Habits: The patient does not participate in regular exercise at present, Exercise limited by: orthopedic condition(s)   Goals Addressed             This Visit's Progress    Weight (lb) < 200 lb (90.7 kg)   195 lb 14.4 oz (88.9 kg)    Eats when stressed       Depression Screen PHQ 2/9 Scores 05/30/2021 05/18/2020 05/13/2019 06/05/2018 05/08/2018 05/08/2018 04/24/2017  PHQ - 2 Score 0 0 0 0 0 0 0  PHQ- 9 Score - - - - 0 - 0    Fall Risk Fall Risk  05/30/2021 05/18/2020 05/13/2019 06/05/2018  05/08/2018  Falls in the past year? 0 0 0 0 0  Number falls in past yr: 0 0 0 - -  Injury with Fall? 0 0 0 - -  Risk for fall due to : No Fall Risks - - - -  Follow up Falls prevention discussed - - - -    FALL RISK PREVENTION PERTAINING TO THE HOME:  Any stairs in or around the home? Yes  If so, are there any without handrails? No  Home free of loose throw rugs in walkways, pet beds, electrical cords, etc? Yes  Adequate lighting in your home to reduce risk of falls? Yes   ASSISTIVE DEVICES UTILIZED TO PREVENT FALLS:  Life alert? No  Use of a cane, walker or w/c? No  Grab bars in the bathroom? Yes  Shower chair or bench in shower? Yes  Elevated toilet seat or a handicapped toilet? No   TIMED UP AND GO:  Was the test performed? Yes .  Length of time to ambulate 10 feet: 6 sec.   Gait slow and steady without use of assistive device  Cognitive Function: Normal cognitive status assessed by direct observation by this Nurse Health Advisor. No abnormalities found.          Immunizations Immunization History  Administered Date(s) Administered   Influenza, High Dose Seasonal PF 04/08/2015, 04/17/2016, 04/01/2017, 03/25/2018, 04/15/2019, 04/30/2020   Influenza-Unspecified 04/08/2021   PFIZER(Purple Top)SARS-COV-2 Vaccination 07/22/2019, 08/05/2019, 04/01/2020   Pneumococcal Conjugate-13 01/20/2014   Pneumococcal Polysaccharide-23 04/17/2016   Td 09/21/2004   Tdap 02/27/2011   Zoster Recombinat (Shingrix) 07/08/2017, 09/09/2017   Zoster, Live 04/08/2006    TDAP status: Due, Education has been provided regarding the importance of this vaccine. Advised may receive this vaccine at local pharmacy or Health Dept. Aware to provide a copy of the vaccination record if obtained from local pharmacy or Health Dept. Verbalized acceptance and understanding.  Flu Vaccine status: Up to date  Pneumococcal vaccine status: Up to date  Covid-19 vaccine status: Completed vaccines  Qualifies  for Shingles Vaccine? Yes   Zostavax completed Yes  Shingrix Completed?: Yes  Screening Tests Health Maintenance  Topic Date Due   COVID-19 Vaccine (4 - Booster) 05/27/2020   TETANUS/TDAP  02/26/2021   DEXA SCAN  08/17/2025   Pneumonia Vaccine 29+ Years old  Completed   INFLUENZA VACCINE  Completed   Zoster Vaccines- Shingrix  Completed   HPV VACCINES  Aged Out    Health Maintenance  Health Maintenance Due  Topic Date Due   COVID-19 Vaccine (4 - Booster) 05/27/2020   TETANUS/TDAP  02/26/2021    Colorectal cancer screening: Type of screening: Colonoscopy. Completed 02/01/15. Repeat every 0 years  Mammogram status: Completed 03/20/21. Repeat every year  Bone Density status: Completed 08/17/20. Results reflect: Bone density results: OSTEOPENIA. Repeat every 0 years.  Lung Cancer Screening: (Low Dose CT Chest recommended if Age 57-80 years, 30 pack-year currently smoking OR have quit w/in 15years.) does not qualify.   Additional Screening:  Hepatitis C Screening: does not qualify; Completed no  Vision Screening: Recommended annual ophthalmology exams for early detection of glaucoma and other disorders of the eye. Is the patient up to date with their annual eye exam?  Yes  Who is the provider or what is the name of the office in which the patient attends annual eye exams? [place] Screening: Recommended annual dental exams for proper oral hygiene  Community Resource Referral / Chronic Care Management: CRR required this visit?  No   CCM required this visit?  No      Plan:     I have personally reviewed and noted the following in the patient's chart:   Medical and social history Use of alcohol, tobacco or illicit drugs  Current medications and supplements including opioid prescriptions.  Functional ability and status Nutritional status Physical activity Advanced directives List of other physicians Hospitalizations, surgeries, and ER visits in  previous 12 months Vitals Screenings to include cognitive, depression, and falls Referrals and appointments  In addition, I have reviewed and discussed with patient certain preventive protocols, quality metrics, and best practice recommendations. A written personalized care plan for preventive services as well as general preventive health recommendations were provided to patient.     Dionisio David, LPN   09/81/1914   Nurse Notes: note

## 2021-05-30 NOTE — Progress Notes (Signed)
I,[name],acting as a scribe for [name], MD.,have documented all relevant documentation on the behalf of [name], MD,as directed by  [name], MD while in the presence of [name], MD.   Complete physical exam   [name]   DOB: 1940-11-20   80 y.o. Female  MRN: [id number] Visit Date: 05/30/2021  Today's healthcare provider: [name], MD   Chief Complaint  Patient presents with   Annual Exam   Subjective    Mallory Sanders is a 80 y.o. female who presents today for a complete physical exam.  She reports consuming a general diet. Home exercise routine includes stretching. She generally feels well. She reports sleeping fairly well. She does not have additional problems to discuss today.  Patient is a delightful widowed white female who been widowed since 1997.  She is retired fourth Land.  She has a  2 daughter 6 grandchildren and 10 great-grandchildren HPI  Patient had AWV today with NHA at 9:00 am.  Patient states her ankles always swell but are little bit worse lately.  Always at the end of the day. Past Medical History:  Diagnosis Date   Cancer ([place])    skin cancer on left side of face   Gallstone    Heart murmur 1999   Hemorrhoids    Hepatitis A 1962   Hypertension    Hypothyroidism    s/p thyroidectomy   S/P repair of ventral hernia 09/25/2003   Lower abdominal incarcerated ventral hernia repaired with Kugel patch   Past Surgical History:  Procedure Laterality Date   [employer]  2005   Dr. Bary Castilla   COLONOSCOPY WITH PROPOFOL N/A 02/01/2015   Procedure: COLONOSCOPY WITH PROPOFOL;  Surgeon: Robert Bellow, MD;  Location: [facility] ENDOSCOPY;  Service: Endoscopy;  Laterality: N/A;   HERNIA REPAIR  2005   Lower abdominal incarcerated ventral hernia repaired with Kugel patch   HERNIA REPAIR  0/10/93   umbilical hernia repair    REPLACEMENT TOTAL KNEE  04/26/10   right   SKIN CANCER EXCISION  12/2014   THYROIDECTOMY  08/2005   TOTAL KNEE ARTHROPLASTY  04/13/2009   [facility], left knee   Social History   Socioeconomic History   Marital status: Widowed    Spouse name: Not on file   Number of children: 1   Years of education: Master's   Highest education level: Master's degree (e.g., MA, MS, MEng, MEd, MSW, MBA)  Occupational History   Occupation: Retired, Pharmacist, hospital  Tobacco Use   Smoking status: Former    Packs/day: 0.50    Types: Cigarettes    Quit date: 07/02/1985    Years since quitting: 35.9   Smokeless tobacco: Never  Vaping Use   Vaping Use: Never used  Substance and Sexual Activity   Alcohol use: Yes    Comment: seldom 1 glass of wine   Drug use: No   Sexual activity: Not on file  Other Topics Concern   Not on file  Social History Narrative   Widowed   Does not get regular exercise   Social Determinants of Health   Financial Resource Strain: Low Risk    Difficulty of Paying Living Expenses: Not hard at all  Food Insecurity: No Food Insecurity   Worried About Charity fundraiser in the Last Year: Never true   [place] in the Last  Year: Never true  Transportation Needs: No Transportation Needs   Lack of Transportation (Medical): No   Lack of Transportation (Non-Medical): No  Physical Activity: Insufficiently Active   Days of Exercise per Week: 1 day   Minutes of Exercise per Session: 20 min  Stress: No Stress Concern Present   Feeling of Stress : Not at all  Social Connections: Moderately Isolated   Frequency of Communication with Friends and Family: More than three times a week   Frequency of Social Gatherings with Friends and Family: More than three times a week   Attends Religious Services: Never   Marine scientist or Organizations: Yes   Attends Archivist Meetings: 1 to 4 times per year   Marital Status: Widowed  Human resources officer Violence: Not At Risk   Fear  of Current or Ex-Partner: No   Emotionally Abused: No   Physically Abused: No   Sexually Abused: No   Family Status  Relation Name Status   Mother  Deceased at age 39       Leukemia   Father  Deceased at age 39   Mat 47 half sister Deceased   Mat Uncle  Deceased   Cousin maternal (Not Specified)   Cousin maternal (Not Specified)   Daughter  Alive   Neg Hx  (Not Specified)   Family History  Problem Relation Age of Onset   Cancer Mother    Breast cancer Mother 58   Leukemia Mother    Stroke Father    Breast cancer Maternal Aunt    Colon cancer Maternal Uncle    Breast cancer Cousin    Breast cancer Cousin    Coronary artery disease Neg Hx    Allergies  Allergen Reactions   Propoxyphene N-Acetaminophen    Streptomycin    Tramadol Hcl    Ultra Antioxidant Formula [Anti-Oxidant] Nausea Only    Patient Care Team: Jerrol Banana., MD as PCP - General (Family Medicine) Bary Castilla, Forest Gleason, MD as Consulting Physician (General Surgery) Eulogio Bear, MD as Consulting Physician (Ophthalmology) Margaretha Sheffield, MD as Consulting Physician (Otolaryngology) Oneta Rack, MD as Consulting Physician (Dermatology)   Medications: Outpatient Medications Prior to Visit  Medication Sig   aspirin 81 MG tablet Take 81 mg by mouth daily.    Calcium Carbonate-Vitamin D (CALCIUM-VITAMIN D) 500-200 MG-UNIT per tablet Take 1 tablet by mouth 3 (three) times daily.     dimenhyDRINATE (DRAMAMINE) 50 MG tablet Take 50 mg by mouth as needed.   fexofenadine (ALLEGRA) 180 MG tablet Take 180 mg by mouth daily.    hydrochlorothiazide (HYDRODIURIL) 12.5 MG tablet TAKE ONE TABLET EVERY DAY   Ibuprofen 200 MG CAPS Take 2 capsules by mouth as needed.   lansoprazole (PREVACID) 30 MG capsule TAKE 1 CAPSULE EVERY DAY   levothyroxine (SYNTHROID, LEVOTHROID) 100 MCG tablet Take 1 tablet (100 mcg total) by mouth daily before breakfast.   meclizine (ANTIVERT) 25 MG tablet Take 25 mg by mouth 3  (three) times daily as needed for dizziness.   Multiple Vitamins-Minerals (PRESERVISION AREDS 2 PO) Take 1 tablet by mouth 2 (two) times daily.   Nutritional Supplements (JUICE PLUS FIBRE PO) Take 2 capsules by mouth 2 (two) times daily.    triamcinolone (NASACORT) 55 MCG/ACT AERO nasal inhaler Place 2 sprays into the nose daily.    [DISCONTINUED] lisinopril (ZESTRIL) 10 MG tablet TAKE ONE TABLET EVERY DAY *NEED TO SCHEDULE FOR OFFICE USE VISIT*   No facility-administered medications prior  to visit.    Review of Systems  HENT:  Positive for hearing loss and tinnitus.   All other systems reviewed and are negative.     Objective     Vitals:  BP 150/90 Important   (BP Location: Left Arm, Patient Position: Sitting, Cuff Size: Normal) Pulse 76 Temp 97.9 F (36.6 C) (Oral) Ht 5\' 4"  (1.626 m) Wt 195 lb 14.4 oz (88.9 kg) SpO2 100% BMI 33.63 kg/m BSA 2 m    BP Readings from Last 3 Encounters:  05/30/21 (!) 155/73  05/30/21 (!) 150/90  05/18/20 136/70   Wt Readings from Last 3 Encounters:  05/30/21 195 lb 14.4 oz (88.9 kg)  05/18/20 197 lb (89.4 kg)  05/18/20 197 lb 9.6 oz (89.6 kg)      Physical Exam Vitals reviewed. Exam conducted with a chaperone present.  Constitutional:      Appearance: Normal appearance. She is well-developed.  HENT:     Head: Normocephalic and atraumatic.     Right Ear: Tympanic membrane, ear canal and external ear normal.     Left Ear: Tympanic membrane, ear canal and external ear normal.     Nose: Nose normal.     Mouth/Throat:     Mouth: Mucous membranes are dry.     Pharynx: Uvula midline.  Eyes:     General: Lids are normal.     Conjunctiva/sclera: Conjunctivae normal.     Pupils: Pupils are equal, round, and reactive to light.  Neck:     Thyroid: No thyroid mass or thyromegaly.     Vascular: No carotid bruit.     Trachea: Trachea normal.  Cardiovascular:     Rate and Rhythm: Normal rate and regular rhythm.     Heart sounds: Normal  heart sounds.  Pulmonary:     Effort: Pulmonary effort is normal.     Breath sounds: Normal breath sounds.  Abdominal:     General: Bowel sounds are normal.     Palpations: Abdomen is soft.     Tenderness: There is no abdominal tenderness.  Musculoskeletal:     Cervical back: Normal range of motion and neck supple.     Comments: Mild lymphedema noted lower extremity  Lymphadenopathy:     Cervical: No cervical adenopathy.  Skin:    General: Skin is warm and dry.  Neurological:     General: No focal deficit present.     Mental Status: She is alert and oriented to person, place, and time.     Cranial Nerves: No cranial nerve deficit.  Psychiatric:        Mood and Affect: Mood normal.        Speech: Speech normal.        Behavior: Behavior normal.        Thought Content: Thought content normal.        Judgment: Judgment normal.      Last depression screening scores PHQ 2/9 Scores 05/30/2021 05/18/2020 05/13/2019  PHQ - 2 Score 0 0 0  PHQ- 9 Score - - -   Last fall risk screening Fall Risk  05/30/2021  Falls in the past year? 0  Number falls in past yr: 0  Injury with Fall? 0  Risk for fall due to : No Fall Risks  Follow up Falls prevention discussed   Last Audit-C alcohol use screening Alcohol Use Disorder Test (AUDIT) 05/30/2021  1. How often do you have a drink containing alcohol? 0  2. How many drinks containing alcohol  do you have on a typical day when you are drinking? 0  3. How often do you have six or more drinks on one occasion? 0  AUDIT-C Score 0  Alcohol Brief Interventions/Follow-up -   A score of 3 or more in women, and 4 or more in men indicates increased risk for alcohol abuse, EXCEPT if all of the points are from question 1   No results found for any visits on 05/30/21.  Assessment & Plan    Routine Health Maintenance and Physical Exam  Exercise Activities and Dietary recommendations  Goals      Weight (lb) < 180 lb (81.6 kg)     Recommend to  continue current diet plan of eating 3 small meals a day with 2 healthy snacks and increase daily water intake to help aid in weight loss.      Weight (lb) < 200 lb (90.7 kg)     Eats when stressed        Immunization History  Administered Date(s) Administered   Influenza, High Dose Seasonal PF 04/08/2015, 04/17/2016, 04/01/2017, 03/25/2018, 04/15/2019, 04/30/2020   Influenza-Unspecified 04/08/2021   PFIZER(Purple Top)SARS-COV-2 Vaccination 07/22/2019, 08/05/2019, 04/01/2020   Pneumococcal Conjugate-13 01/20/2014   Pneumococcal Polysaccharide-23 04/17/2016   Td 09/21/2004   Tdap 02/27/2011   Zoster Recombinat (Shingrix) 07/08/2017, 09/09/2017   Zoster, Live 04/08/2006    Health Maintenance  Topic Date Due   COVID-19 Vaccine (4 - Booster) 05/27/2020   TETANUS/TDAP  02/26/2021   DEXA SCAN  08/17/2025   Pneumonia Vaccine 83+ Years old  Completed   INFLUENZA VACCINE  Completed   Zoster Vaccines- Shingrix  Completed   HPV VACCINES  Aged Out    Discussed health benefits of physical activity, and encouraged her to engage in regular exercise appropriate for her age and condition.  1. Annual physical exam  - Lipid panel - CBC w/Diff/Platelet - Comprehensive Metabolic Panel (CMET)  2. Benign hypertension Increase lisinopril from 10 to 20 mg and follow-up in a couple of months. - Lipid panel - CBC w/Diff/Platelet - Comprehensive Metabolic Panel (CMET) - lisinopril (ZESTRIL) 20 MG tablet; Take 1 tablet (20 mg total) by mouth daily.  Dispense: 90 tablet; Refill: 1  3. Hypercholesterolemia  - Lipid panel - CBC w/Diff/Platelet - Comprehensive Metabolic Panel (CMET)  4. Hypothyroidism, unspecified type  - Lipid panel - CBC w/Diff/Platelet - Comprehensive Metabolic Panel (CMET)  5. Class 1 obesity due to excess calories with serious comorbidity and body mass index (BMI) of 32.0 to 32.9 in adult  - Lipid panel - CBC w/Diff/Platelet - Comprehensive Metabolic Panel  (CMET)  6. Estrogen deficiency  - Lipid panel - CBC w/Diff/Platelet - Comprehensive Metabolic Panel (CMET)   Return in about 4 months (around 09/27/2021).     I, [name], MD, have reviewed all documentation for this visit. The documentation on 05/31/21 for the exam, diagnosis, procedures, and orders are all accurate and complete.    Katana Berthold Cranford Mon, MD  [facility] [phone] (phone) [phone] (fax)  [place]

## 2021-05-31 ENCOUNTER — Telehealth: Payer: Self-pay | Admitting: Family Medicine

## 2021-05-31 LAB — CBC WITH DIFFERENTIAL/PLATELET
Basophils Absolute: 0 10*3/uL (ref 0.0–0.2)
Basos: 1 %
EOS (ABSOLUTE): 0.1 10*3/uL (ref 0.0–0.4)
Eos: 1 %
Hematocrit: 38.2 % (ref 34.0–46.6)
Hemoglobin: 12.9 g/dL (ref 11.1–15.9)
Immature Grans (Abs): 0 10*3/uL (ref 0.0–0.1)
Immature Granulocytes: 0 %
Lymphocytes Absolute: 1.2 10*3/uL (ref 0.7–3.1)
Lymphs: 27 %
MCH: 32.1 pg (ref 26.6–33.0)
MCHC: 33.8 g/dL (ref 31.5–35.7)
MCV: 95 fL (ref 79–97)
Monocytes Absolute: 0.5 10*3/uL (ref 0.1–0.9)
Monocytes: 12 %
Neutrophils Absolute: 2.6 10*3/uL (ref 1.4–7.0)
Neutrophils: 59 %
Platelets: 226 10*3/uL (ref 150–450)
RBC: 4.02 x10E6/uL (ref 3.77–5.28)
RDW: 12 % (ref 11.7–15.4)
WBC: 4.4 10*3/uL (ref 3.4–10.8)

## 2021-05-31 LAB — COMPREHENSIVE METABOLIC PANEL
ALT: 8 IU/L (ref 0–32)
AST: 17 IU/L (ref 0–40)
Albumin/Globulin Ratio: 2.3 — ABNORMAL HIGH (ref 1.2–2.2)
Albumin: 4.6 g/dL (ref 3.6–4.6)
Alkaline Phosphatase: 90 IU/L (ref 44–121)
BUN/Creatinine Ratio: 16 (ref 12–28)
BUN: 15 mg/dL (ref 8–27)
Bilirubin Total: 0.4 mg/dL (ref 0.0–1.2)
CO2: 21 mmol/L (ref 20–29)
Calcium: 9.5 mg/dL (ref 8.7–10.3)
Chloride: 97 mmol/L (ref 96–106)
Creatinine, Ser: 0.93 mg/dL (ref 0.57–1.00)
Globulin, Total: 2 g/dL (ref 1.5–4.5)
Glucose: 86 mg/dL (ref 70–99)
Potassium: 4.7 mmol/L (ref 3.5–5.2)
Sodium: 133 mmol/L — ABNORMAL LOW (ref 134–144)
Total Protein: 6.6 g/dL (ref 6.0–8.5)
eGFR: 61 mL/min/{1.73_m2} (ref >59)

## 2021-05-31 LAB — LIPID PANEL
Chol/HDL Ratio: 2.5 ratio (ref 0.0–4.4)
Cholesterol, Total: 209 mg/dL — ABNORMAL HIGH (ref 100–199)
HDL: 83 mg/dL (ref >39)
LDL Chol Calc (NIH): 117 mg/dL — ABNORMAL HIGH (ref 0–99)
Triglycerides: 47 mg/dL (ref 0–149)
VLDL Cholesterol Cal: 9 mg/dL (ref 5–40)

## 2021-05-31 NOTE — Telephone Encounter (Signed)
Per Dr. Rosanna Randy, advised patient that yes she is to take medications together.

## 2021-05-31 NOTE — Telephone Encounter (Signed)
Pt calling for lab results which were reviewed, message from Dr. Rosanna Randy read.  Pt also questioning meds. See earlier encounter.  States is continuing lower dose of Lisinopril until she hears if she is to continue HCTZ with Lisinopril 20mg .  Please advise. [phone]

## 2021-05-31 NOTE — Telephone Encounter (Signed)
Patient called for lab results.

## 2021-09-26 NOTE — Progress Notes (Signed)
?  ? ? ?Established patient visit ? ?I,Mallory Sanders,acting as a scribe for Mallory Durie, MD.,have documented all relevant documentation on the behalf of Mallory Durie, MD,as directed by  Mallory Durie, MD while in the presence of Mallory Durie, MD. ? ? ?[name]   DOB: 03-02-1941   81 y.o. Female  MRN: [id number] ?Visit Date: 09/27/2021 ? ?Today's healthcare provider: [name], MD  ? ?Chief Complaint  ?Patient presents with  ? Follow-up  ? Hypertension  ? ?Subjective  ?  ?HPI  ?Patient comes in today for follow-up.  She is feeling well.  He has dry macular degeneration. ?He has allergies and is on Allegra for these but they are flared up this spring. ?Blood pressure seems to be running a little higher lately. ?Still lives independently and has no problems. ?Hypertension, follow-up ? ?BP Readings from Last 3 Encounters:  ?09/27/21 (!) 148/74  ?05/30/21 (!) 155/73  ?05/30/21 (!) 150/90  ? Wt Readings from Last 3 Encounters:  ?09/27/21 196 lb (88.9 kg)  ?05/30/21 195 lb 14.4 oz (88.9 kg)  ?05/18/20 197 lb (89.4 kg)  ?  ? ?She was last seen for hypertension 4 months ago.  ?BP at that visit was 155/73. ?Management since that visit includes; Increased lisinopril from 10 to 20 mg and follow-up in a couple of months. ?She reports good compliance with treatment. ?She is not having side effects. none ?She is exercising. ?She is adherent to low salt diet.   ?Outside blood pressures are bp machine stop working. ? ?She does not smoke. ? ?Use of agents associated with hypertension: none.  ? ?--------------------------------------------------------------------------------------------------- ? ? ?Medications: ?Outpatient Medications Prior to Visit  ?Medication Sig  ? aspirin 81 MG tablet Take 81 mg by mouth daily.   ? Calcium Carbonate-Vitamin D (CALCIUM-VITAMIN D) 500-200 MG-UNIT per tablet Take 1 tablet by mouth 3 (three) times daily.    ? dimenhyDRINATE (DRAMAMINE) 50 MG tablet Take  50 mg by mouth as needed.  ? fexofenadine (ALLEGRA) 180 MG tablet Take 180 mg by mouth daily.   ? hydrochlorothiazide (HYDRODIURIL) 12.5 MG tablet TAKE ONE TABLET EVERY DAY  ? Ibuprofen 200 MG CAPS Take 2 capsules by mouth as needed.  ? lansoprazole (PREVACID) 30 MG capsule TAKE 1 CAPSULE EVERY DAY  ? levothyroxine (SYNTHROID, LEVOTHROID) 100 MCG tablet Take 1 tablet (100 mcg total) by mouth daily before breakfast.  ? lisinopril (ZESTRIL) 20 MG tablet Take 1 tablet (20 mg total) by mouth daily.  ? meclizine (ANTIVERT) 25 MG tablet Take 25 mg by mouth 3 (three) times daily as needed for dizziness.  ? Multiple Vitamins-Minerals (PRESERVISION AREDS 2 PO) Take 1 tablet by mouth 2 (two) times daily.  ? Nutritional Supplements (JUICE PLUS FIBRE PO) Take 2 capsules by mouth 2 (two) times daily.   ? triamcinolone (NASACORT) 55 MCG/ACT AERO nasal inhaler Place 2 sprays into the nose daily.   ? ?No facility-administered medications prior to visit.  ? ? ?Review of Systems  ?Constitutional:  Negative for appetite change, chills, fatigue and fever.  ?Respiratory:  Negative for chest tightness and shortness of breath.   ?Cardiovascular:  Negative for chest pain and palpitations.  ?Gastrointestinal:  Negative for abdominal pain, nausea and vomiting.  ?Neurological:  Negative for dizziness and weakness.  ? ?Last metabolic panel ?Lab Results  ?Component Value Date  ? GLUCOSE 86 05/30/2021  ? NA 133 (L) 05/30/2021  ? K 4.7 05/30/2021  ? CL 97 05/30/2021  ?  CO2 21 05/30/2021  ? BUN 15 05/30/2021  ? CREATININE 0.93 05/30/2021  ? EGFR 61 05/30/2021  ? CALCIUM 9.5 05/30/2021  ? PROT 6.6 05/30/2021  ? ALBUMIN 4.6 05/30/2021  ? LABGLOB 2.0 05/30/2021  ? AGRATIO 2.3 (H) 05/30/2021  ? BILITOT 0.4 05/30/2021  ? ALKPHOS 90 05/30/2021  ? AST 17 05/30/2021  ? ALT 8 05/30/2021  ? ?  ?  Objective  ?  ?BP (!) 148/74 (BP Location: Left Arm, Patient Position: Sitting, Cuff Size: Large)   Pulse 66   Temp 98.2 ?F (36.8 ?C) (Temporal)   Resp 16   Ht  '5\' 4"'  (1.626 m)   Wt 196 lb (88.9 kg)   SpO2 98%   BMI 33.64 kg/m?  ?BP Readings from Last 3 Encounters:  ?09/27/21 (!) 148/74  ?05/30/21 (!) 155/73  ?05/30/21 (!) 150/90  ? ?Wt Readings from Last 3 Encounters:  ?09/27/21 196 lb (88.9 kg)  ?05/30/21 195 lb 14.4 oz (88.9 kg)  ?05/18/20 197 lb (89.4 kg)  ? ?  ? ?Physical Exam ?Vitals reviewed.  ?Constitutional:   ?   Appearance: She is well-developed.  ?HENT:  ?   Head: Normocephalic and atraumatic.  ?   Right Ear: External ear normal.  ?   Left Ear: External ear normal.  ?   Nose: Nose normal.  ?Eyes:  ?   Conjunctiva/sclera: Conjunctivae normal.  ?   Pupils: Pupils are equal, round, and reactive to light.  ?Cardiovascular:  ?   Rate and Rhythm: Normal rate and regular rhythm.  ?   Heart sounds: Normal heart sounds.  ?Pulmonary:  ?   Effort: Pulmonary effort is normal.  ?   Breath sounds: Normal breath sounds.  ?Abdominal:  ?   General: Bowel sounds are normal.  ?   Palpations: Abdomen is soft.  ?Musculoskeletal:     ?   General: Normal range of motion.  ?   Cervical back: Normal range of motion and neck supple.  ?Skin: ?   General: Skin is warm and dry.  ?Neurological:  ?   Mental Status: She is alert and oriented to person, place, and time.  ?Psychiatric:     ?   Behavior: Behavior normal.     ?   Thought Content: Thought content normal.     ?   Judgment: Judgment normal.  ?  ? ? ?No results found for any visits on 09/27/21. ? Assessment & Plan  ?  ? ?1. Benign hypertension ?Add amlodipine to Zestoretic.  Follow-up in a couple of months ?- amLODipine (NORVASC) 5 MG tablet; Take 1 tablet (5 mg total) by mouth daily.  Dispense: 90 tablet; Refill: 1 ? ?2. Allergic rhinitis, unspecified seasonality, unspecified trigger ?Add Flonase to Allegra ?- fluticasone (FLONASE) 50 MCG/ACT nasal spray; Place 1 spray into both nostrils daily as needed for allergies or rhinitis.  Dispense: 18.2 mL; Refill: 2 ? ?3. Gastroesophageal reflux disease without esophagitis ?Patient  states she requires Prevacid ? ?4. Class 1 obesity due to excess calories with serious comorbidity and body mass index (BMI) of 33.0 to 33.9 in adult ? ? ?5. 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 ? ? ?Return in about 3 months (around 12/28/2021).  ?   ? ?I, Mallory Durie, MD, have reviewed all documentation for this visit. The documentation on 09/29/21 for the exam, diagnosis, procedures, and orders are all accurate and complete. ? ? ? ?Lamyia Cdebaca Cranford Mon, MD  ?[facility] ?[phone] (  phone) ?[phone] (fax) ? ?[facility] ?

## 2021-09-27 ENCOUNTER — Ambulatory Visit (INDEPENDENT_AMBULATORY_CARE_PROVIDER_SITE_OTHER): Payer: Medicare PPO | Admitting: Family Medicine

## 2021-09-27 ENCOUNTER — Encounter: Payer: Self-pay | Admitting: Family Medicine

## 2021-09-27 ENCOUNTER — Other Ambulatory Visit: Payer: Self-pay

## 2021-09-27 VITALS — BP 148/74 | HR 66 | Temp 98.2°F | Resp 16 | Ht 64.0 in | Wt 196.0 lb

## 2021-09-27 DIAGNOSIS — J309 Allergic rhinitis, unspecified: Secondary | ICD-10-CM | POA: Diagnosis not present

## 2021-09-27 DIAGNOSIS — K219 Gastro-esophageal reflux disease without esophagitis: Secondary | ICD-10-CM

## 2021-09-27 DIAGNOSIS — Z6833 Body mass index (BMI) 33.0-33.9, adult: Secondary | ICD-10-CM | POA: Diagnosis not present

## 2021-09-27 DIAGNOSIS — H353130 Nonexudative age-related macular degeneration, bilateral, stage unspecified: Secondary | ICD-10-CM | POA: Diagnosis not present

## 2021-09-27 DIAGNOSIS — I10 Essential (primary) hypertension: Secondary | ICD-10-CM

## 2021-09-27 DIAGNOSIS — E6609 Other obesity due to excess calories: Secondary | ICD-10-CM | POA: Diagnosis not present

## 2021-09-27 MED ORDER — FLUTICASONE PROPIONATE 50 MCG/ACT NA SUSP: 1.0000 | Freq: Every day | NASAL | 2 refills | Status: DC | PRN

## 2021-09-27 MED ORDER — AMLODIPINE BESYLATE 5 MG PO TABS: 5.0000 mg | ORAL_TABLET | Freq: Every day | ORAL | 1 refills | Status: DC

## 2021-10-11 ENCOUNTER — Ambulatory Visit: Payer: Self-pay | Admitting: *Deleted

## 2021-10-11 NOTE — Telephone Encounter (Signed)
?  Chief Complaint: elevated BP and nose bleed on Monday x 1 ?Symptoms: nose bleed started at 6 am on Monday after blowing nose. Reports "lots of bleeding" took less than 10 minutes to stop. Last nose bleed in 1987. No more nose bleeds since Monday. Reports elevated BP 131/90 this am. No other sx. Only taking amlodipine for BP. Stopped lisinopril and hydrodiuril. Misunderstood MD recommendations to start new medication.  ?Frequency: x 1 on Monday  ?Pertinent Negatives: Patient denies chest pain , difficulty breathing, headache, no dizziness. No other neurological issues.  ?Disposition: '[]'$ ED /'[]'$ Urgent Care (no appt availability in office) / '[]'$ Appointment(In office/virtual)/ '[]'$  [place] [facility]/ '[x]'$ Home Care/ '[]'$ Refused Recommended Disposition /'[]'$ [place] [facility]/ '[x]'$  Follow-up with PCP ?Additional Notes:  ?Medication list shows patient is to take lisinopril 20 mg daily and hydrodiuril 12.5 mg daily with new medication in march added amlodipine 5 mg . OV MD note does not report to stop previous medications. Patient misunderstood MD orders. Please have PCP clarify if patient is to stop any medications. Patient has not been taking lisinopril or hydrodiuril since started amlodipine. Patient would like recommendation what time of day to take medications.  ? ? ? Reason for Disposition ? [1] Mild-moderate nosebleed AND [2] bleeding stopped now ? ?Answer Assessment - Initial Assessment Questions ?1. AMOUNT OF BLEEDING: "How bad is the bleeding?" "How much blood was lost?" "Has the bleeding stopped?" ?  - MILD: needed a couple tissues ?  - MODERATE: needed many tissues ?  - SEVERE: large blood clots, soaked many tissues, lasted more than 30 minutes  ?    Moderate on Monday  ?2. ONSET: "When did the nosebleed start?"  ?    Monday morning at 6 am after blowing nose  ?3. FREQUENCY: "How many nosebleeds have you had in the last 24 hours?"  ?    None only one on Monday  ?4. RECURRENT SYMPTOMS: "Have there been other  recent nosebleeds?" If Yes, ask: "How long did it take you to stop the bleeding?" "What worked best?"  ?    No last one in 1987 ?5. CAUSE: "What do you think caused this nosebleed?" ?    Not sure  ?6. LOCAL FACTORS: "Do you have any cold symptoms?", "Have you been rubbing or picking at your nose?" ?    No  ?7. SYSTEMIC FACTORS: "Do you have high blood pressure or any bleeding problems?" ?    Yes high blood pressure. Not sure if taking medication as directed ?8. BLOOD THINNERS: "Do you take any blood thinners?" (e.g., aspirin, clopidogrel / Plavix, coumadin, heparin). Notes: Other strong blood thinners include: Arixtra (fondaparinux), Eliquis (apixaban), Pradaxa (dabigatran), and Xarelto (rivaroxaban). ?    aspirin ?9. OTHER SYMPTOMS: "Do you have any other symptoms?" (e.g., lightheadedness) ?    Denies . BP more elevated than before 130's/90's ?10. PREGNANCY: "Is there any chance you are pregnant?" "When was your last menstrual period?" ?      na ? ?Protocols used: Nosebleed-A-AH ? ?

## 2021-10-11 NOTE — Telephone Encounter (Signed)
Patient was advised. Patient states she stopped taking lisinopril and HCTZ when she got amlodipine 5 mg. Patient states she will start back on other meds and keep check on her bp's, before increasing to 40 mg lisinopril. ?

## 2021-10-11 NOTE — Telephone Encounter (Signed)
Please advise 

## 2021-10-18 ENCOUNTER — Telehealth: Payer: Self-pay | Admitting: Family Medicine

## 2021-10-18 NOTE — Telephone Encounter (Signed)
Pt called to report her BP readings for the week ? ?4.13.23 -  ?12:05pm - 124/72 ?7:30pm - 119/65 ?11:45pm - 112/67 ? ?4.14.23 -  ?11:25am - 119/71 ?4:25pm - 113/67 ?10:52pm - 107/65 ? ?4.15.23 -  ?12:42pm - 107/73 ?5:00pm - 103/64 ?10:50pm - 106/65 ? ?4.16.23 -  ?10:15am - 108/72 ?5:20pm - 117/65 ?10:45pm - 101/62 ? ?4.17.23 -  ?8:45am - 113/69 ?5:10pm - 110/75 ?11:45pm - 109/67 ? ?4.18.23 ?10:30am - 115/72 ?8:00pm - 114/70 ?9:53pm - 108/65 ? ?4.19.23 ?This morning it was 114/72 / she asked if the nurse can give her a call to let her know if she needs to change anything or remain doing the same / please advise  ? ?

## 2021-10-20 NOTE — Telephone Encounter (Signed)
Patient advised.

## 2021-11-16 ENCOUNTER — Other Ambulatory Visit: Payer: Self-pay | Admitting: Family Medicine

## 2021-11-16 DIAGNOSIS — I10 Essential (primary) hypertension: Secondary | ICD-10-CM

## 2021-11-17 NOTE — Telephone Encounter (Signed)
Requested Prescriptions  Pending Prescriptions Disp Refills  . lisinopril (ZESTRIL) 20 MG tablet [Pharmacy Med Name: LISINOPRIL 20 MG TAB] 90 tablet 1    Sig: TAKE ONE TABLET BY MOUTH EVERY DAY     Cardiovascular:  ACE Inhibitors Failed - 11/16/2021  4:39 PM      Failed - Last BP in normal range    BP Readings from Last 1 Encounters:  09/27/21 (!) 148/74         Passed - Cr in normal range and within 180 days    Creat  Date Value Ref Range Status  04/24/2017 0.96 (H) 0.60 - 0.93 mg/dL Final    Comment:    For patients >83 years of age, the reference limit for Creatinine is approximately 13% higher for people identified as African-American. .    Creatinine, Ser  Date Value Ref Range Status  05/30/2021 0.93 0.57 - 1.00 mg/dL Final         Passed - K in normal range and within 180 days    Potassium  Date Value Ref Range Status  05/30/2021 4.7 3.5 - 5.2 mmol/L Final  03/16/2013 4.7 3.5 - 5.1 mmol/L Final         Passed - Patient is not pregnant      Passed - Valid encounter within last 6 months    Recent Outpatient Visits          1 month ago Benign hypertension   [facility] Jerrol Banana., MD   5 months ago Annual physical exam   [facility] Jerrol Banana., MD   1 year ago Annual physical exam   [facility] Jerrol Banana., MD   2 years ago Annual physical exam   [facility] Jerrol Banana., MD   2 years ago Benign hypertension   [facility] Jerrol Banana., MD      Future Appointments            In 2 months Jerrol Banana., MD [facility] [place]

## 2021-11-21 ENCOUNTER — Other Ambulatory Visit: Payer: Self-pay | Admitting: Family Medicine

## 2021-11-21 DIAGNOSIS — Z0000 Encounter for general adult medical examination without abnormal findings: Secondary | ICD-10-CM

## 2021-11-21 DIAGNOSIS — I10 Essential (primary) hypertension: Secondary | ICD-10-CM

## 2021-11-30 ENCOUNTER — Ambulatory Visit: Payer: Medicare PPO | Admitting: Physician Assistant

## 2021-11-30 ENCOUNTER — Encounter: Payer: Self-pay | Admitting: Physician Assistant

## 2021-11-30 VITALS — BP 139/56 | HR 70 | Ht 64.0 in | Wt 196.1 lb

## 2021-11-30 DIAGNOSIS — I952 Hypotension due to drugs: Secondary | ICD-10-CM

## 2021-11-30 DIAGNOSIS — T148XXA Other injury of unspecified body region, initial encounter: Secondary | ICD-10-CM

## 2021-11-30 NOTE — Progress Notes (Signed)
    I,Sha'taria Tyson,acting as a Education administrator for [employer], PA-C.,have documented all relevant documentation on the behalf of Mikey Kirschner, PA-C,as directed by  Mikey Kirschner, PA-C while in the presence of Mikey Kirschner, PA-C.   Acute Office Visit  Subjective:     Patient ID: Mallory Sanders, female    DOB: 07-02-1941, 81 y.o.   MRN: [id number]  Cc. Bruise, right arm; hypotension  [name] is a 81 y/o female who presents today with a large bruise on her right forearm with a lump inside. Unsure the injury. Denies pain, full Rom of the right arm  She also reports since adding amlodipine 5 mg; she has recorded low blood pressures at home. Denies increase in dizziness, syncope. Lows of 80s/50s, highest recorded 116/60s.     Objective:  Blood pressure (!) 139/56, pulse 70, height '5\' 4"'$  (1.626 m), weight 196 lb 1.6 oz (89 kg), SpO2 100 %.   Physical Exam Vitals reviewed.  Constitutional:      Appearance: She is not ill-appearing.  HENT:     Head: Normocephalic.  Eyes:     Conjunctiva/sclera: Conjunctivae normal.  Cardiovascular:     Rate and Rhythm: Normal rate.  Pulmonary:     Effort: Pulmonary effort is normal. No respiratory distress.  Skin:    Comments: Right posterior forearm with a large 5-6 cm ecchymosis; central palpable mobile hematoma, < 1 cm. No breaks in the skin.  Neurological:     General: No focal deficit present.     Mental Status: She is alert and oriented to person, place, and time.  Psychiatric:        Mood and Affect: Mood normal.        Behavior: Behavior normal.    No results found for any visits on 11/30/21.      Assessment & Plan:   Hematoma right arm Advised watchful waiting, can ice.  2. Hypotension Advised cutting amlodipine in half or skipping every other day. Will reach out to PCP for additional recommendations.  I, Mikey Kirschner, PA-C have reviewed all documentation for this visit. The documentation on  11/30/2021 for the exam,  diagnosis, procedures, and orders are all accurate and complete.  Mikey Kirschner, PA-C [facility] [address] [place], [place], [address] Office: [phone] Fax: [phone]

## 2021-12-07 DIAGNOSIS — H353132 Nonexudative age-related macular degeneration, bilateral, intermediate dry stage: Secondary | ICD-10-CM | POA: Diagnosis not present

## 2022-01-03 DIAGNOSIS — I788 Other diseases of capillaries: Secondary | ICD-10-CM | POA: Diagnosis not present

## 2022-01-03 DIAGNOSIS — L814 Other melanin hyperpigmentation: Secondary | ICD-10-CM | POA: Diagnosis not present

## 2022-01-13 IMAGING — MG MM DIGITAL SCREENING BILAT W/ TOMO AND CAD
8 series · 8 of 24 positions shown · non-contrast
Comparison: Previous exam(s).

ACR Breast Density Category a: The breast tissue is almost entirely
fatty.

CLINICAL DATA: Screening.

EXAM:
DIGITAL SCREENING BILATERAL MAMMOGRAM WITH TOMOSYNTHESIS AND CAD
TECHNIQUE: Bilateral screening digital craniocaudal and mediolateral oblique
mammograms were obtained. Bilateral screening digital breast
tomosynthesis was performed. The images were evaluated with
computer-aided detection.

[R CC synth-2D]
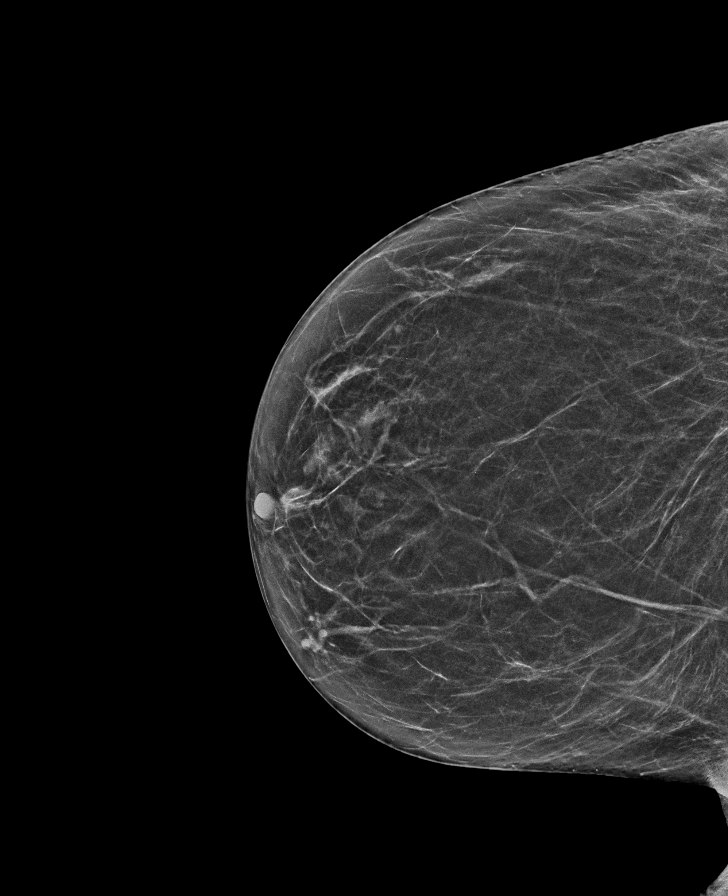

[R MLO synth-2D]
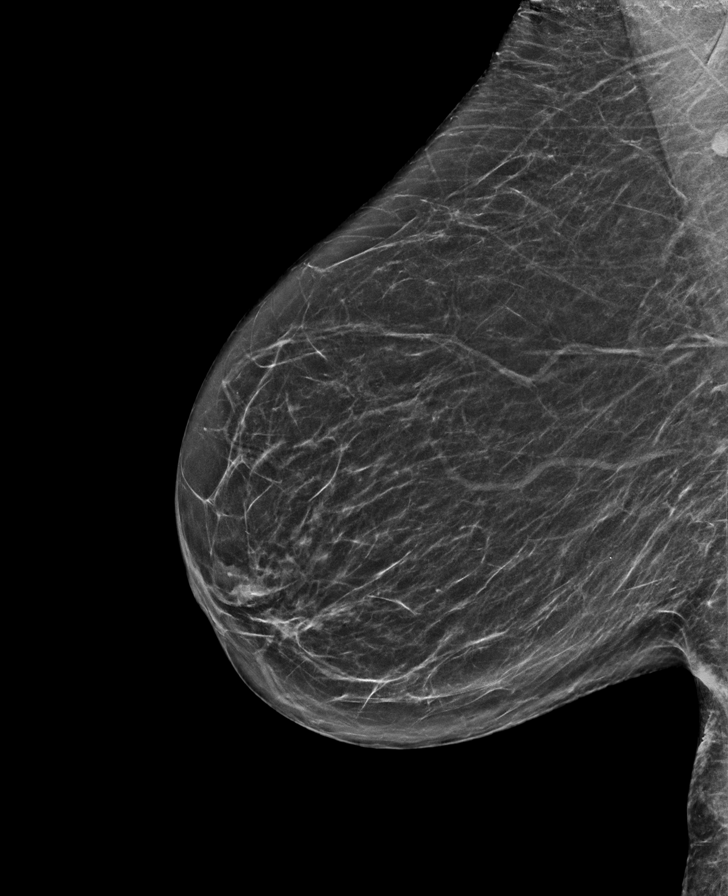

[L CC synth-2D]
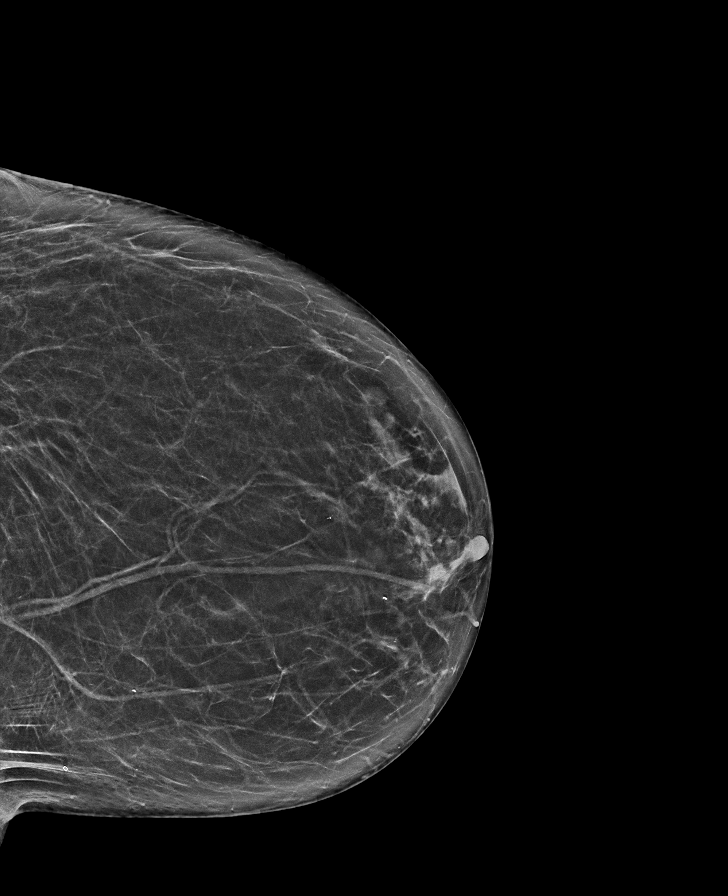

[L MLO synth-2D]
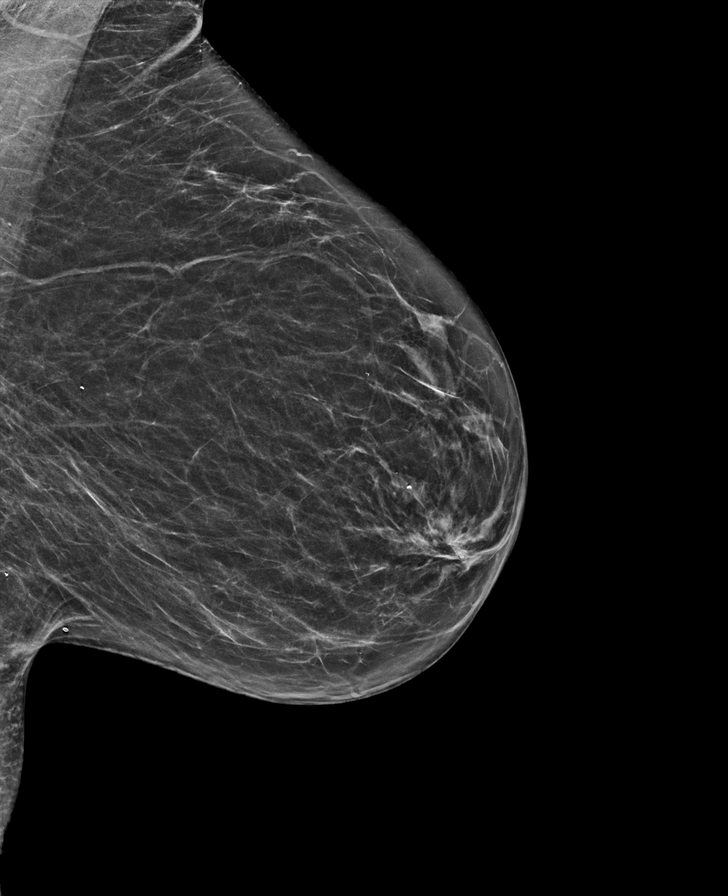

[R MLO tomo · tomo slice 31/61.0]
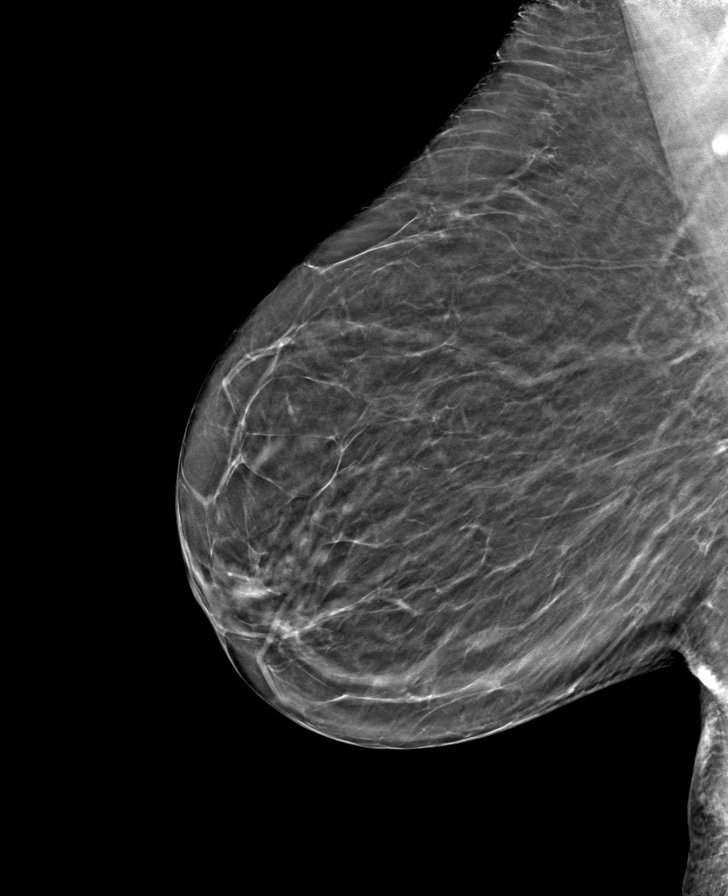

[L CC tomo · tomo slice 33/65.0]
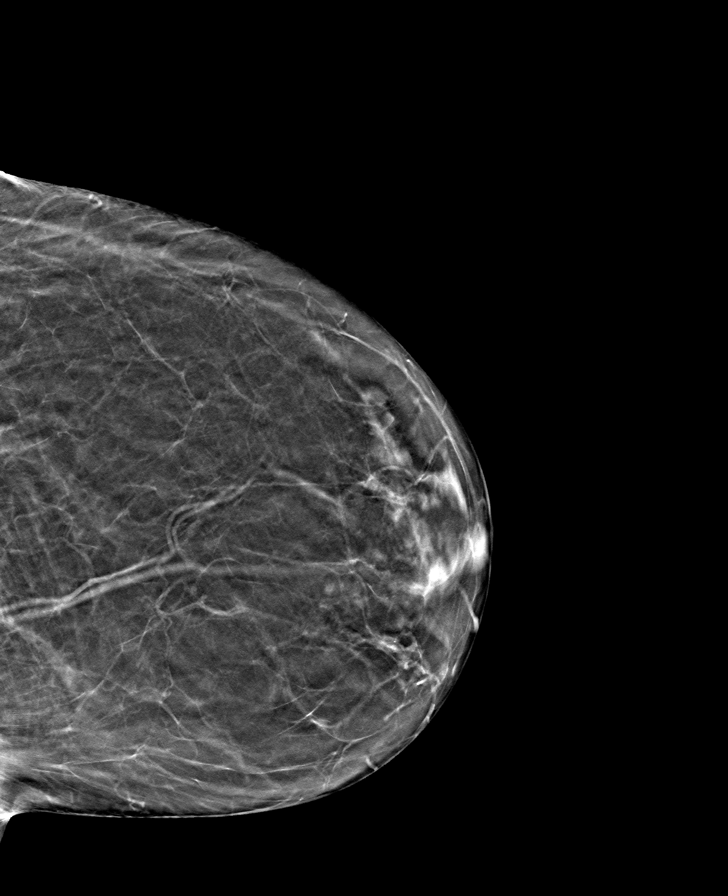

[R CC tomo · tomo slice 27/53.0]
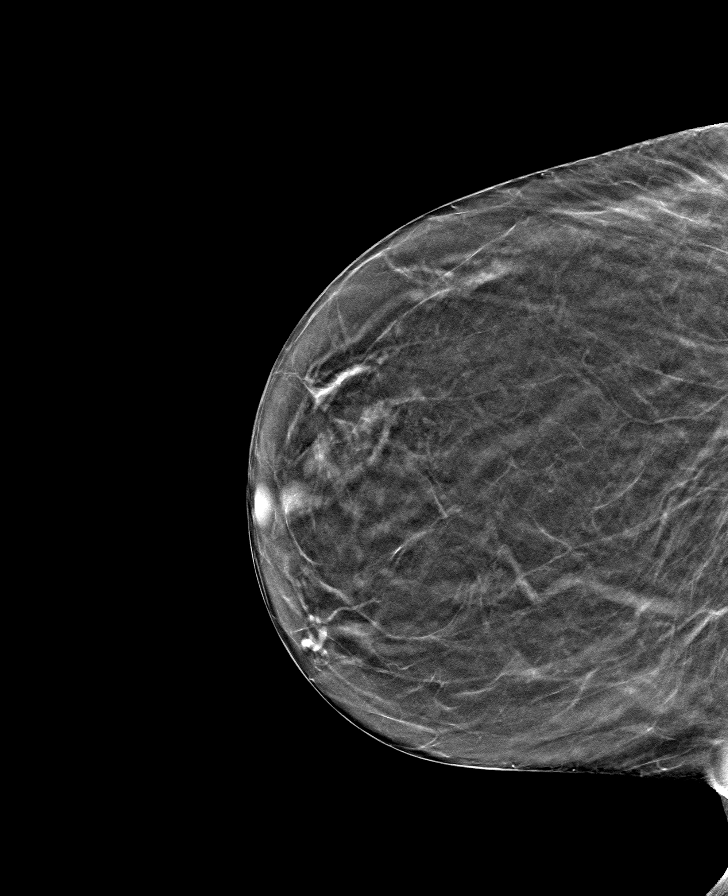

[L MLO tomo · tomo slice 31/62.0]
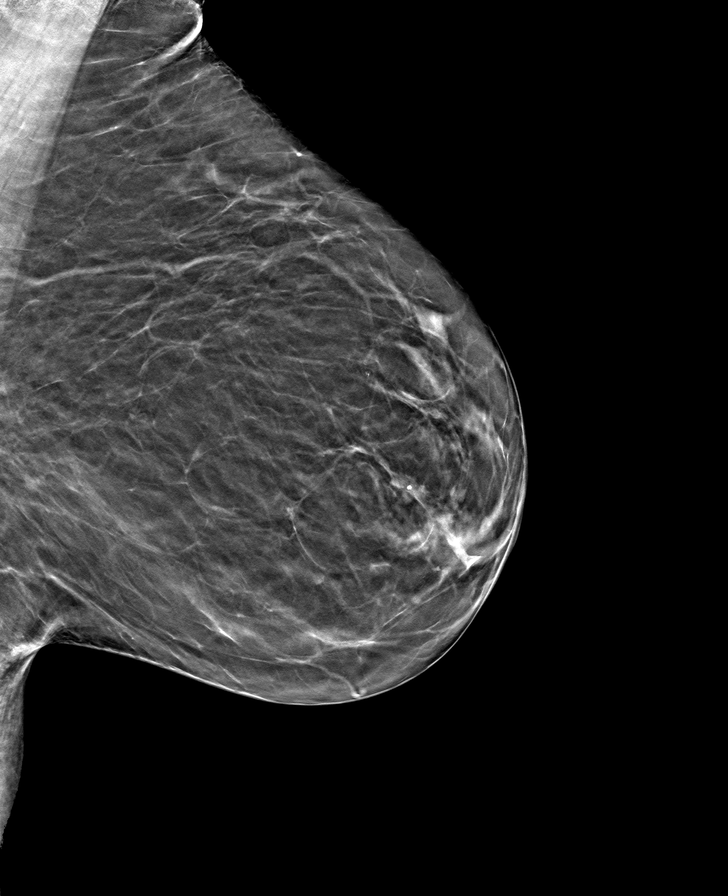

[8 of 24 positions shown; findings below may reference images not displayed]

FINDINGS: There are no findings suspicious for malignancy.
IMPRESSION: No mammographic evidence of malignancy. A result letter of this
screening mammogram will be mailed directly to the patient.

RECOMMENDATION:
Screening mammogram in one year. (Code:0E-3-N98)

BI-RADS CATEGORY  1: Negative.

## 2022-01-18 ENCOUNTER — Encounter: Payer: Self-pay | Admitting: Family Medicine

## 2022-01-18 ENCOUNTER — Ambulatory Visit: Payer: Medicare PPO | Admitting: Family Medicine

## 2022-01-18 VITALS — BP 134/55 | HR 71 | Resp 16 | Wt 196.0 lb

## 2022-01-18 DIAGNOSIS — E7800 Pure hypercholesterolemia, unspecified: Secondary | ICD-10-CM | POA: Diagnosis not present

## 2022-01-18 DIAGNOSIS — Z6833 Body mass index (BMI) 33.0-33.9, adult: Secondary | ICD-10-CM

## 2022-01-18 DIAGNOSIS — I952 Hypotension due to drugs: Secondary | ICD-10-CM

## 2022-01-18 DIAGNOSIS — I10 Essential (primary) hypertension: Secondary | ICD-10-CM | POA: Diagnosis not present

## 2022-01-18 DIAGNOSIS — H353130 Nonexudative age-related macular degeneration, bilateral, stage unspecified: Secondary | ICD-10-CM

## 2022-01-18 DIAGNOSIS — E6609 Other obesity due to excess calories: Secondary | ICD-10-CM | POA: Diagnosis not present

## 2022-01-18 NOTE — Progress Notes (Signed)
Established patient visit  I,Mallory Sanders,acting as a scribe for Mallory Durie, MD.,have documented all relevant documentation on the behalf of Mallory Durie, MD,as directed by  Mallory Durie, MD while in the presence of Mallory Durie, MD.   [name]   DOB: 12/05/1940   81 y.o. Female  MRN: [id number] Visit Date: 01/18/2022  Today's healthcare provider: [name], MD   Chief Complaint  Patient presents with   Follow-up   Hypotension   Subjective    HPI  Follow up for Hypotension:  The patient was last seen for this 1 1/2 months ago. Changes made at last visit include Advised cutting amlodipine in half or skipping every other day. Will reach out to PCP for additional recommendations.  -----------------------------------------------------------------------------------------   Medications: Outpatient Medications Prior to Visit  Medication Sig   aspirin 81 MG tablet Take 81 mg by mouth daily.    Calcium Carbonate-Vitamin D (CALCIUM-VITAMIN D) 500-200 MG-UNIT per tablet Take 1 tablet by mouth 3 (three) times daily.     dimenhyDRINATE (DRAMAMINE) 50 MG tablet Take 50 mg by mouth as needed.   fexofenadine (ALLEGRA) 180 MG tablet Take 180 mg by mouth daily.    fluticasone (FLONASE) 50 MCG/ACT nasal spray Place 1 spray into both nostrils daily as needed for allergies or rhinitis.   hydrochlorothiazide (HYDRODIURIL) 12.5 MG tablet TAKE ONE TABLET EVERY DAY   Ibuprofen 200 MG CAPS Take 2 capsules by mouth as needed.   lansoprazole (PREVACID) 30 MG capsule TAKE 1 CAPSULE EVERY DAY   levothyroxine (SYNTHROID, LEVOTHROID) 100 MCG tablet Take 1 tablet (100 mcg total) by mouth daily before breakfast.   lisinopril (ZESTRIL) 20 MG tablet TAKE ONE TABLET BY MOUTH EVERY DAY   meclizine (ANTIVERT) 25 MG tablet Take 25 mg by mouth 3 (three) times daily as needed for dizziness.   Multiple Vitamins-Minerals (PRESERVISION AREDS 2 PO) Take 1 tablet by  mouth 2 (two) times daily.   Nutritional Supplements (JUICE PLUS FIBRE PO) Take 2 capsules by mouth 2 (two) times daily.    triamcinolone (NASACORT) 55 MCG/ACT AERO nasal inhaler Place 2 sprays into the nose daily.    [DISCONTINUED] amLODipine (NORVASC) 5 MG tablet Take 1 tablet (5 mg total) by mouth daily.   No facility-administered medications prior to visit.    Review of Systems  Constitutional:  Negative for appetite change, chills, fatigue and fever.  Respiratory:  Negative for chest tightness and shortness of breath.   Cardiovascular:  Negative for chest pain and palpitations.  Gastrointestinal:  Negative for abdominal pain, nausea and vomiting.  Neurological:  Negative for dizziness and weakness.    Last thyroid functions Lab Results  Component Value Date   TSH 1.880 05/18/2020       Objective    BP (!) 134/55 (BP Location: Left Arm, Patient Position: Sitting, Cuff Size: Large)   Pulse 71   Resp 16   Wt 196 lb (88.9 kg)   SpO2 98%   BMI 33.64 kg/m  BP Readings from Last 3 Encounters:  01/18/22 (!) 134/55  11/30/21 (!) 139/56  09/27/21 (!) 148/74   Wt Readings from Last 3 Encounters:  01/18/22 196 lb (88.9 kg)  11/30/21 196 lb 1.6 oz (89 kg)  09/27/21 196 lb (88.9 kg)      Physical Exam Vitals reviewed.  Constitutional:      Appearance: She is well-developed.  HENT:     Head: Normocephalic and atraumatic.  Right Ear: External ear normal.     Left Ear: External ear normal.     Nose: Nose normal.  Eyes:     Conjunctiva/sclera: Conjunctivae normal.     Pupils: Pupils are equal, round, and reactive to light.  Cardiovascular:     Rate and Rhythm: Normal rate and regular rhythm.     Heart sounds: Normal heart sounds.  Pulmonary:     Effort: Pulmonary effort is normal.     Breath sounds: Normal breath sounds.  Abdominal:     General: Bowel sounds are normal.     Palpations: Abdomen is soft.  Musculoskeletal:        General: Normal range of motion.      Cervical back: Normal range of motion and neck supple.  Skin:    General: Skin is warm and dry.  Neurological:     Mental Status: She is alert and oriented to person, place, and time.  Psychiatric:        Behavior: Behavior normal.        Thought Content: Thought content normal.        Judgment: Judgment normal.       No results found for any visits on 01/18/22.  Assessment & Plan     1. Benign hypertension Doing well off of amlodipine.  Blood pressure back in normal range. Left greater than right lymphedema. 2. Class 1 obesity due to excess calories with serious comorbidity and body mass index (BMI) of 33.0 to 33.9 in adult Minimal amount of weight loss needed as patient is pear-shaped apple shape.  3. Hypercholesterolemia   4. 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      I, Mallory Durie, MD, have reviewed all documentation for this visit. The documentation on 01/21/22 for the exam, diagnosis, procedures, and orders are all accurate and complete.    Mallory Zhao Cranford Mon, MD  [facility] [phone] (phone) [phone] (fax)  [place]

## 2022-02-17 ENCOUNTER — Other Ambulatory Visit: Payer: Self-pay | Admitting: Family Medicine

## 2022-02-17 DIAGNOSIS — I10 Essential (primary) hypertension: Secondary | ICD-10-CM

## 2022-02-26 ENCOUNTER — Other Ambulatory Visit: Payer: Self-pay | Admitting: Family Medicine

## 2022-02-26 ENCOUNTER — Telehealth: Payer: Self-pay | Admitting: Family Medicine

## 2022-02-26 DIAGNOSIS — Z1231 Encounter for screening mammogram for malignant neoplasm of breast: Secondary | ICD-10-CM

## 2022-02-26 NOTE — Telephone Encounter (Signed)
Patient called and wanted to know where was Dr. Rosanna Randy going after leaving [facility]. Advised pt that he was leaving [facility] but unsure on his next place of employment would be. Please advise pt on updates on Dr. Alben Spittle new location.

## 2022-03-07 ENCOUNTER — Ambulatory Visit: Payer: Self-pay | Admitting: *Deleted

## 2022-03-07 ENCOUNTER — Telehealth: Payer: Self-pay | Admitting: *Deleted

## 2022-03-07 NOTE — Patient Outreach (Signed)
  Care Coordination   03/07/2022 Name: Mallory Sanders MRN: [id number] DOB: 05-Mar-1941   Care Coordination Outreach Attempts:  An unsuccessful telephone outreach was attempted today to offer the patient information about available care coordination services as a benefit of their health plan.   Follow Up Plan:  Additional outreach attempts will be made to offer the patient care coordination information and services.   Encounter Outcome:  No Answer  Care Coordination Interventions Activated:  No   Care Coordination Interventions:  No, not indicated    Laiza Veenstra, [place] Worker  [facility] Management [phone]

## 2022-03-07 NOTE — Patient Outreach (Signed)
  Care Coordination   Initial Visit Note   03/07/2022 Name: Mallory Sanders MRN: [id number] DOB: 1941-05-27  Mallory Sanders is a 81 y.o. year old female who sees Jerrol Banana., MD for primary care. I spoke with  Mallory Sanders by phone today.  What matters to the patients health and wellness today?  Patient would like to get established with the [facility] Provider before starting case management services    Goals Addressed             This Visit's Progress    care management activities       Care Coordination Interventions: Case Management program offered and discussed Patient confirmed that she will be transitioning to the [facility] for primary care and would like to get established with them before consenting to care management services Patient encouraged to contact this social worker(once established with a provided of the [facility] per patient's request) This social worker's contact information provided          SDOH assessments and interventions completed:  No     Care Coordination Interventions Activated:  Yes  Care Coordination Interventions:  Yes, provided   Follow up plan: No further intervention required.   Encounter Outcome:  Pt. Visit Completed

## 2022-03-07 NOTE — Patient Instructions (Signed)
Visit Information  Thank you for taking time to visit with me today. Please don't hesitate to contact me if I can be of assistance to you.   Following are the goals we discussed today:   Goals Addressed             This Visit's Progress    care management activities       Care Coordination Interventions: Case Management program offered and discussed Patient confirmed that she will be transitioning to the [facility] for primary care and would like to get established with them before consenting to care management services Patient encouraged to contact this social worker(once established with a provided of the [facility] per patient's request) This social worker's contact information provided          Please call the care guide team at [phone] to schedule an appointment was established with the [facility].  If you are experiencing a Mental Health or [place] or need someone to talk to, please call 911   Patient verbalizes understanding of instructions and care plan provided today and agrees to view in [place]. Active MyChart status and patient understanding of how to access instructions and care plan via MyChart confirmed with patient.       Elliot Gurney, [place] Worker  [facility] Care Management [phone]

## 2022-03-09 ENCOUNTER — Other Ambulatory Visit: Payer: Self-pay | Admitting: Internal Medicine

## 2022-03-09 DIAGNOSIS — Z1231 Encounter for screening mammogram for malignant neoplasm of breast: Secondary | ICD-10-CM

## 2022-03-16 ENCOUNTER — Other Ambulatory Visit: Payer: Self-pay | Admitting: Family Medicine

## 2022-03-16 DIAGNOSIS — Z0000 Encounter for general adult medical examination without abnormal findings: Secondary | ICD-10-CM

## 2022-03-16 DIAGNOSIS — K219 Gastro-esophageal reflux disease without esophagitis: Secondary | ICD-10-CM

## 2022-03-21 DIAGNOSIS — Z79899 Other long term (current) drug therapy: Secondary | ICD-10-CM | POA: Diagnosis not present

## 2022-03-21 DIAGNOSIS — Z23 Encounter for immunization: Secondary | ICD-10-CM | POA: Diagnosis not present

## 2022-03-21 DIAGNOSIS — R8290 Unspecified abnormal findings in urine: Secondary | ICD-10-CM | POA: Diagnosis not present

## 2022-03-21 DIAGNOSIS — I10 Essential (primary) hypertension: Secondary | ICD-10-CM | POA: Diagnosis not present

## 2022-03-21 DIAGNOSIS — E039 Hypothyroidism, unspecified: Secondary | ICD-10-CM | POA: Diagnosis not present

## 2022-03-21 DIAGNOSIS — Z0000 Encounter for general adult medical examination without abnormal findings: Secondary | ICD-10-CM | POA: Diagnosis not present

## 2022-03-21 DIAGNOSIS — E782 Mixed hyperlipidemia: Secondary | ICD-10-CM | POA: Diagnosis not present

## 2022-03-23 ENCOUNTER — Ambulatory Visit: Payer: Self-pay | Admitting: *Deleted

## 2022-03-23 NOTE — Patient Outreach (Signed)
Phone call from patient stating that she would like to receive case management services, however would like written material first.  Welcome packet mailed to patient's home. Follow up phone call to be scheduled for next week   [place], [place] Management [phone]

## 2022-04-03 ENCOUNTER — Ambulatory Visit
Admission: RE | Admit: 2022-04-03 | Discharge: 2022-04-03 | Disposition: A | Discharge status: Home / Self Care | Payer: Medicare PPO | Source: Ambulatory Visit | Attending: Family Medicine | Admitting: Family Medicine

## 2022-04-03 DIAGNOSIS — Z1231 Encounter for screening mammogram for malignant neoplasm of breast: Secondary | ICD-10-CM | POA: Insufficient documentation

## 2022-04-05 ENCOUNTER — Ambulatory Visit: Payer: Self-pay | Admitting: *Deleted

## 2022-04-05 NOTE — Patient Instructions (Signed)
Visit Information  Thank you for taking time to visit with me today. Please don't hesitate to contact me if I can be of assistance to you.   Following are the goals we discussed today:   Goals Addressed             This Visit's Progress    Care Coordination activities       Care Coordination Interventions:   Patient received information packet mailed to her regarding case management services SDOH screen/Needs Assessment completed, patient confirmed having no community resource needs and states that her medical conditions are well managed Transportation resource provided for [phone] if needed in the future Patient now established with [place] scheduled fpr 05/31/22 Patient encouraged to call this social worker if any community resource needs arise         If you are experiencing a Mental Health or [place] or need someone to talk to, please call the Suicide and Crisis Lifeline: [phone]   Patient verbalizes understanding of instructions and care plan provided today and agrees to view in [place]. Active MyChart status and patient understanding of how to access instructions and care plan via MyChart confirmed with patient.     No further follow up required: patient call this social worker if there are any further questions or concerns  Elliot Gurney, [place] Worker  [facility] Care Management [phone]

## 2022-04-05 NOTE — Patient Outreach (Signed)
  Care Coordination   Initial Visit Note   04/05/2022 Name: Mallory Sanders MRN: [id number] DOB: 06-04-1941  Mallory Sanders is a 81 y.o. year old female who sees Sparks, Leonie Douglas, MD for primary care. I spoke with  Ansel Bong by phone today.  What matters to the patients health and wellness today?  Patient confirms having no community resource or nursing needs at this time    Goals Addressed             This Visit's Progress    Care Coordination activities       Care Coordination Interventions:   Patient received information packet mailed to her regarding case management services SDOH screen/Needs Assessment completed, patient confirmed having no community resource needs and states that her medical conditions are well managed Transportation resource provided for [phone] if needed in the future Patient now established with [place] scheduled fpr 05/31/22 Patient encouraged to call this social worker if any community resource needs arise         SDOH assessments and interventions completed:  Yes  SDOH Interventions Today    Flowsheet Row Most Recent Value  SDOH Interventions   Food Insecurity Interventions Intervention Not Indicated  Housing Interventions Intervention Not Indicated  Transportation Interventions Intervention Not Indicated  Stress Interventions Intervention Not Indicated        Care Coordination Interventions Activated:  Yes  Care Coordination Interventions:  Yes, provided   Follow up plan: No further intervention required.   Encounter Outcome:  Pt. Visit Completed

## 2022-05-31 ENCOUNTER — Encounter: Payer: Medicare PPO | Admitting: Family Medicine

## 2023-03-07 ENCOUNTER — Encounter: Payer: Self-pay | Admitting: Ophthalmology

## 2023-03-14 NOTE — Discharge Instructions (Signed)

## 2023-03-18 ENCOUNTER — Other Ambulatory Visit: Payer: Self-pay

## 2023-03-18 ENCOUNTER — Encounter
Admission: RE | Disposition: A | Discharge status: Home / Self Care | Payer: Self-pay | Source: Home / Self Care | Attending: Ophthalmology

## 2023-03-18 ENCOUNTER — Ambulatory Visit: Payer: Medicare PPO | Admitting: Anesthesiology

## 2023-03-18 ENCOUNTER — Encounter: Payer: Self-pay | Admitting: Ophthalmology

## 2023-03-18 ENCOUNTER — Ambulatory Visit
Admission: RE | Admit: 2023-03-18 | Discharge: 2023-03-18 | Disposition: A | Discharge status: Home / Self Care | Payer: Medicare PPO | Attending: Ophthalmology | Admitting: Ophthalmology

## 2023-03-18 DIAGNOSIS — B159 Hepatitis A without hepatic coma: Secondary | ICD-10-CM | POA: Insufficient documentation

## 2023-03-18 DIAGNOSIS — Z87891 Personal history of nicotine dependence: Secondary | ICD-10-CM | POA: Insufficient documentation

## 2023-03-18 DIAGNOSIS — I10 Essential (primary) hypertension: Secondary | ICD-10-CM | POA: Diagnosis not present

## 2023-03-18 DIAGNOSIS — H2512 Age-related nuclear cataract, left eye: Secondary | ICD-10-CM | POA: Insufficient documentation

## 2023-03-18 DIAGNOSIS — E039 Hypothyroidism, unspecified: Secondary | ICD-10-CM | POA: Insufficient documentation

## 2023-03-18 HISTORY — DX: Tinnitus, unspecified ear: H93.19

## 2023-03-18 HISTORY — PX: CATARACT EXTRACTION W/PHACO: SHX586

## 2023-03-18 SURGERY — PHACOEMULSIFICATION, CATARACT, WITH IOL INSERTION
Anesthesia: Monitor Anesthesia Care | Laterality: Left

## 2023-03-18 MED ORDER — SIGHTPATH DOSE#1 NA HYALUR & NA CHOND-NA HYALUR IO KIT
PACK | INTRAOCULAR | Status: DC | PRN
  Administered 2023-03-18: 1 via OPHTHALMIC

## 2023-03-18 MED ORDER — BRIMONIDINE TARTRATE-TIMOLOL 0.2-0.5 % OP SOLN
OPHTHALMIC | Status: DC | PRN
Start: 2023-03-18 — End: 2023-03-18
  Administered 2023-03-18: 1 [drp] via OPHTHALMIC

## 2023-03-18 MED ORDER — MIDAZOLAM HCL 2 MG/2ML IJ SOLN
INTRAMUSCULAR | Status: DC | PRN
  Administered 2023-03-18: 1 mg via INTRAVENOUS

## 2023-03-18 MED ORDER — ARMC OPHTHALMIC DILATING DROPS
1.0000 | OPHTHALMIC | Status: DC | PRN
  Administered 2023-03-18 (×3): 1 via OPHTHALMIC

## 2023-03-18 MED ORDER — TETRACAINE HCL 0.5 % OP SOLN
1.0000 [drp] | OPHTHALMIC | Status: DC | PRN
  Administered 2023-03-18 (×3): 1 [drp] via OPHTHALMIC

## 2023-03-18 MED ORDER — SIGHTPATH DOSE#1 BSS IO SOLN
INTRAOCULAR | Status: DC | PRN
  Administered 2023-03-18: 15 mL via INTRAOCULAR

## 2023-03-18 MED ORDER — LIDOCAINE HCL (PF) 2 % IJ SOLN
INTRAOCULAR | Status: DC | PRN
  Administered 2023-03-18: 1 mL via INTRAOCULAR

## 2023-03-18 MED ORDER — SODIUM CHLORIDE 0.9% FLUSH
INTRAVENOUS | Status: DC | PRN
  Administered 2023-03-18: 10 mL via INTRAVENOUS

## 2023-03-18 MED ORDER — FENTANYL CITRATE (PF) 100 MCG/2ML IJ SOLN
INTRAMUSCULAR | Status: DC | PRN
  Administered 2023-03-18: 25 ug via INTRAVENOUS

## 2023-03-18 MED ORDER — LACTATED RINGERS IV SOLN: INTRAVENOUS | Status: DC

## 2023-03-18 MED ORDER — MOXIFLOXACIN HCL 0.5 % OP SOLN
OPHTHALMIC | Status: DC | PRN
  Administered 2023-03-18: .2 mL via OPHTHALMIC

## 2023-03-18 SURGICAL SUPPLY — 11 items
CATARACT SUITE SIGHTPATH (MISCELLANEOUS) ×1
DISSECTOR HYDRO NUCLEUS 50X22 (MISCELLANEOUS) ×1 IMPLANT
FEE CATARACT SUITE SIGHTPATH (MISCELLANEOUS) ×1 IMPLANT
GLOVE SURG GAMMEX PI TX LF 7.5 (GLOVE) ×1 IMPLANT
GLOVE SURG SYN 8.5 E (GLOVE) ×1
GLOVE SURG SYN 8.5 PF PI (GLOVE) ×1 IMPLANT
LENS IOL TECNIS EYHANCE 15.0 (Intraocular Lens) IMPLANT
NDL FILTER BLUNT 18X1 1/2 (NEEDLE) ×1 IMPLANT
NEEDLE FILTER BLUNT 18X1 1/2 (NEEDLE) ×1
SYR 3ML LL SCALE MARK (SYRINGE) ×1 IMPLANT
SYR 5ML LL (SYRINGE) ×1 IMPLANT

## 2023-03-18 NOTE — Transfer of Care (Signed)
Immediate Anesthesia Transfer of Care Note  [name]  Procedure(s) Performed: CATARACT EXTRACTION PHACO AND INTRAOCULAR LENS PLACEMENT (IOC) LEFT 5.44 00:44.2 (Left)  Patient Location: PACU  Anesthesia Type:MAC  Level of Consciousness: awake, alert , and oriented  Airway & Oxygen Therapy: Patient Spontanous Breathing  Post-op Assessment: Report given to RN and Post -op Vital signs reviewed and stable  Post vital signs: Reviewed and stable  Last Vitals:  Vitals Value Taken Time  BP 150/71 03/18/23 1318  Temp 36.4 C 03/18/23 1316  Pulse 66 03/18/23 1319  Resp 14 03/18/23 1319  SpO2 100 % 03/18/23 1319  Vitals shown include unfiled device data.  Last Pain:  Vitals:   03/18/23 1316  TempSrc:   PainSc: 0-No pain         Complications: No notable events documented.

## 2023-03-18 NOTE — H&P (Signed)
[facility]   Primary Care Physician:  Marguarite Arbour, MD Ophthalmologist: Dr. Willey Blade  Pre-Procedure History & Physical: HPI:  Mallory Sanders is a 82 y.o. female here for cataract surgery.   Past Medical History:  Diagnosis Date   Cancer (HCC)    skin cancer on left side of face   Gallstone    Heart murmur 1999   Hemorrhoids    Hepatitis A 1962   Hepatitis A 1962   Hypertension    Hypothyroidism    s/p thyroidectomy   S/P repair of ventral hernia 09/25/2003   Lower abdominal incarcerated ventral hernia repaired with Kugel patch   Tinnitus     Past Surgical History:  Procedure Laterality Date   ABDOMINAL HYSTERECTOMY  07/02/1985   APPENDECTOMY  07/02/1948   COLONOSCOPY  07/03/2003   Dr. Lemar Livings   COLONOSCOPY WITH PROPOFOL N/A 02/01/2015   Procedure: COLONOSCOPY WITH PROPOFOL;  Surgeon: [name], MD;  Location: [facility] ENDOSCOPY;  Service: Endoscopy;  Laterality: N/A;   HERNIA REPAIR  07/03/2003   Lower abdominal incarcerated ventral hernia repaired with Kugel patch   HERNIA REPAIR  03/16/2013   umbilical hernia repair   REPLACEMENT TOTAL KNEE  04/26/2010   right   SKIN CANCER EXCISION  12/31/2014   THYROIDECTOMY  08/30/2005   Dr Elenore Rota - [facility]   TOTAL KNEE ARTHROPLASTY  04/13/2009   [facility], left knee    Prior to Admission medications   Medication Sig Start Date End Date Taking? Authorizing Provider  aspirin 81 MG tablet Take 81 mg by mouth daily.    Yes [provider]  Calcium Carbonate-Vitamin D (CALCIUM-VITAMIN D) 500-200 MG-UNIT per tablet Take 1 tablet by mouth 3 (three) times daily.     Yes [provider]  dimenhyDRINATE (DRAMAMINE) 50 MG tablet Take 50 mg by mouth as needed.   Yes [provider]  fexofenadine (ALLEGRA) 180 MG tablet Take 180 mg by mouth daily.    Yes [provider]  Ibuprofen 200 MG CAPS Take 2 capsules by mouth as needed.   Yes [provider]  lansoprazole (PREVACID) 30  MG capsule TAKE 1 CAPSULE EVERY DAY 03/16/22  Yes Bosie Clos, MD  levothyroxine (SYNTHROID) 75 MCG tablet Take 75 mcg by mouth daily before breakfast. Friday, Saturday   Yes [provider]  levothyroxine (SYNTHROID, LEVOTHROID) 100 MCG tablet Take 1 tablet (100 mcg total) by mouth daily before breakfast. Patient taking differently: Take 100 mcg by mouth daily before breakfast. Sunday - Thursday 10/23/17  Yes Bosie Clos, MD  lisinopril (ZESTRIL) 20 MG tablet TAKE 1 TABLET BY MOUTH DAILY 02/19/22  Yes Bosie Clos, MD  meclizine (ANTIVERT) 25 MG tablet Take 25 mg by mouth 3 (three) times daily as needed for dizziness.   Yes [provider]  mometasone (NASONEX) 50 MCG/ACT nasal spray Place 2 sprays into the nose daily.   Yes [provider]  Multiple Vitamins-Minerals (PRESERVISION AREDS 2 PO) Take 1 tablet by mouth 2 (two) times daily.   Yes [provider]  Nutritional Supplements (JUICE PLUS FIBRE PO) Take 2 capsules by mouth 2 (two) times daily.    Yes [provider]    Allergies as of 02/04/2023 - Review Complete 01/18/2022  Allergen Reaction Noted   Propoxyphene n-acetaminophen  04/17/2010   Streptomycin  04/17/2010   Tramadol hcl     Ultra antioxidant formula [anti-oxidant] Nausea Only 07/29/2012    Family History  Problem Relation  Age of Onset   Cancer Mother    Breast cancer Mother 52   Leukemia Mother    Stroke Father    Breast cancer Maternal Aunt    Colon cancer Maternal Uncle    Breast cancer Cousin    Breast cancer Cousin    Coronary artery disease Neg Hx     Social History   Socioeconomic History   Marital status: Widowed    Spouse name: Not on file   Number of children: 1   Years of education: Master's   Highest education level: Master's degree (e.g., MA, MS, MEng, MEd, MSW, MBA)  Occupational History   Occupation: Retired, Runner, broadcasting/film/video  Tobacco Use   Smoking status: Former    Current packs/day:  0.00    Average packs/day: 0.1 packs/day for 12.0 years (1.2 ttl pk-yrs)    Types: Cigarettes    Start date: 4    Quit date: 07/02/1985    Years since quitting: 37.7   Smokeless tobacco: Never  Vaping Use   Vaping status: Never Used  Substance and Sexual Activity   Alcohol use: Yes    Comment: seldom 1 glass of wine   Drug use: No   Sexual activity: Not on file  Other Topics Concern   Not on file  Social History Narrative   Widowed   Does not get regular exercise   Social Determinants of Health   Financial Resource Strain: Low Risk  (05/30/2021)   Overall Financial Resource Strain (CARDIA)    Difficulty of Paying Living Expenses: Not hard at all  Food Insecurity: No Food Insecurity (04/05/2022)   Hunger Vital Sign    Worried About Running Out of Food in the Last Year: Never true    Ran Out of Food in the Last Year: Never true  Transportation Needs: No Transportation Needs (04/05/2022)   PRAPARE - Administrator, Civil Service (Medical): No    Lack of Transportation (Non-Medical): No  Physical Activity: Insufficiently Active (05/30/2021)   Exercise Vital Sign    Days of Exercise per Week: 1 day    Minutes of Exercise per Session: 20 min  Stress: No Stress Concern Present (04/05/2022)   [employer] of Occupational Health - Occupational Stress Questionnaire    Feeling of Stress : Not at all  Social Connections: Moderately Isolated (04/05/2022)   Social Connection and Isolation Panel [NHANES]    Frequency of Communication with Friends and Family: More than three times a week    Frequency of Social Gatherings with Friends and Family: More than three times a week    Attends Religious Services: Never    Database administrator or Organizations: Yes    Attends Engineer, structural: More than 4 times per year    Marital Status: Widowed  Intimate Partner Violence: Not At Risk (05/30/2021)   Humiliation, Afraid, Rape, and Kick questionnaire    Fear of  Current or Ex-Partner: No    Emotionally Abused: No    Physically Abused: No    Sexually Abused: No    Review of Systems: See HPI, otherwise negative ROS  Physical Exam: BP (!) 176/68   Temp 97.7 F (36.5 C) (Temporal)   Resp (!) 21   Ht 5' 4.02" (1.626 m)   Wt 89 kg   SpO2 99%   BMI 33.68 kg/m  General:   Alert, cooperative in NAD Head:  Normocephalic and atraumatic. Respiratory:  Normal work of breathing. Cardiovascular:  RRR  Impression/Plan: Dola Argyle  Pry is here for cataract surgery.  Risks, benefits, limitations, and alternatives regarding cataract surgery have been reviewed with the patient.  Questions have been answered.  All parties agreeable.   Willey Blade, MD  03/18/2023, 12:42 PM

## 2023-03-18 NOTE — Anesthesia Preprocedure Evaluation (Addendum)
Anesthesia Evaluation  Patient identified by MRN, date of birth, ID band Patient awake    Reviewed: Allergy & Precautions, H&P , NPO status , Patient's Chart, lab work & pertinent test results  Airway Mallampati: III  TM Distance: >3 FB Neck ROM: Full    Dental no notable dental hx.    Pulmonary former smoker   Pulmonary exam normal breath sounds clear to auscultation       Cardiovascular hypertension, Normal cardiovascular exam+ Valvular Problems/Murmurs  Rhythm:Regular Rate:Normal     Neuro/Psych negative neurological ROS  negative psych ROS   GI/Hepatic negative GI ROS, Neg liver ROS,GERD  ,,(+) Hepatitis -  Endo/Other  negative endocrine ROSHypothyroidism    Renal/GU negative Renal ROS  negative genitourinary   Musculoskeletal negative musculoskeletal ROS (+) Arthritis ,    Abdominal   Peds negative pediatric ROS (+)  Hematology negative hematology ROS (+)   Anesthesia Other Findings Hemorrhoids  Hepatitis A Hypothyroidism  Hypertension Heart murmur Gallstone S/P repair of ventral hernia  Cancer (HCC) Tinnitus     Reproductive/Obstetrics negative OB ROS                             Anesthesia Physical Anesthesia Plan  ASA: 3  Anesthesia Plan: MAC   Post-op Pain Management:    Induction: Intravenous  PONV Risk Score and Plan:   Airway Management Planned: Natural Airway and Nasal Cannula  Additional Equipment:   Intra-op Plan:   Post-operative Plan:   Informed Consent: I have reviewed the patients History and Physical, chart, labs and discussed the procedure including the risks, benefits and alternatives for the proposed anesthesia with the patient or authorized representative who has indicated his/her understanding and acceptance.     Dental Advisory Given  Plan Discussed with: Anesthesiologist, CRNA and Surgeon  Anesthesia Plan Comments: (Patient consented  for risks of anesthesia including but not limited to:  - adverse reactions to medications - damage to eyes, teeth, lips or other oral mucosa - nerve damage due to positioning  - sore throat or hoarseness - Damage to heart, brain, nerves, lungs, other parts of body or loss of life  Patient voiced understanding.)        Anesthesia Quick Evaluation

## 2023-03-18 NOTE — Op Note (Signed)
OPERATIVE NOTE  Mallory Sanders [id number] 03/18/2023   PREOPERATIVE DIAGNOSIS:  Nuclear sclerotic cataract left eye.  H25.12   POSTOPERATIVE DIAGNOSIS:    Nuclear sclerotic cataract left eye.     PROCEDURE:  Phacoemusification with posterior chamber intraocular lens placement of the left eye   LENS:   Implant Name Type Inv. Item Serial No. Manufacturer Lot No. LRB No. Used Action  LENS IOL TECNIS EYHANCE 15.0 - [id number] Intraocular Lens LENS IOL TECNIS EYHANCE 15.0 [id number] SIGHTPATH  Left 1 Implanted      Procedure(s): CATARACT EXTRACTION PHACO AND INTRAOCULAR LENS PLACEMENT (IOC) LEFT 5.44 00:44.2 (Left)  SURGEON:  Willey Blade, MD, MPH   ANESTHESIA:  Topical with tetracaine drops augmented with 1% preservative-free intracameral lidocaine.  ESTIMATED BLOOD LOSS: <1 mL   COMPLICATIONS:  None.   DESCRIPTION OF PROCEDURE:  The patient was identified in the holding room and transported to the operating room and placed in the supine position under the operating microscope.  The left eye was identified as the operative eye and it was prepped and draped in the usual sterile ophthalmic fashion.   A 1.0 millimeter clear-corneal paracentesis was made at the 5:00 position. 0.5 ml of preservative-free 1% lidocaine with epinephrine was injected into the anterior chamber.  The anterior chamber was filled with viscoelastic.  A 2.4 millimeter keratome was used to make a near-clear corneal incision at the 2:00 position.  A curvilinear capsulorrhexis was made with a cystotome and capsulorrhexis forceps.  Balanced salt solution was used to hydrodissect and hydrodelineate the nucleus.   Phacoemulsification was then used in stop and chop fashion to remove the lens nucleus and epinucleus.  The remaining cortex was then removed using the irrigation and aspiration handpiece. Viscoelastic was then placed into the capsular bag to distend it for lens placement.  A lens was then injected into the capsular  bag.  The remaining viscoelastic was aspirated.   Wounds were hydrated with balanced salt solution.  The anterior chamber was inflated to a physiologic pressure with balanced salt solution.  Intracameral vigamox 0.1 mL undiltued was injected into the eye and a drop placed onto the ocular surface.  No wound leaks were noted.  The patient was taken to the recovery room in stable condition without complications of anesthesia or surgery  Willey Blade 03/18/2023, 1:15 PM

## 2023-03-19 NOTE — Anesthesia Postprocedure Evaluation (Signed)
Anesthesia Post Note  [name]  Procedure(s) Performed: CATARACT EXTRACTION PHACO AND INTRAOCULAR LENS PLACEMENT (IOC) LEFT 5.44 00:44.2 (Left)  Patient location during evaluation: PACU Anesthesia Type: MAC Level of consciousness: awake and alert Pain management: pain level controlled Vital Signs Assessment: post-procedure vital signs reviewed and stable Respiratory status: spontaneous breathing, nonlabored ventilation, respiratory function stable and patient connected to nasal cannula oxygen Cardiovascular status: stable and blood pressure returned to baseline Postop Assessment: no apparent nausea or vomiting Anesthetic complications: no   No notable events documented.   Last Vitals:  Vitals:   03/18/23 1316 03/18/23 1324  BP: (!) 150/71 (!) 160/68  Pulse: 68 61  Resp: 18 15  Temp: (!) 36.4 C (!) 36.4 C  SpO2: 100% 100%    Last Pain:  Vitals:   03/18/23 1324  TempSrc:   PainSc: 0-No pain                 Shareena Nusz C Dieter Hane

## 2023-03-26 NOTE — Anesthesia Preprocedure Evaluation (Addendum)
Anesthesia Evaluation  Patient identified by MRN, date of birth, ID band Patient awake    Reviewed: Allergy & Precautions, H&P , NPO status , Patient's Chart, lab work & pertinent test results  Airway Mallampati: III  TM Distance: >3 FB Neck ROM: Full    Dental no notable dental hx.    Pulmonary neg pneumonia , former smoker   Pulmonary exam normal breath sounds clear to auscultation       Cardiovascular hypertension, Normal cardiovascular exam+ Valvular Problems/Murmurs  Rhythm:Regular Rate:Normal     Neuro/Psych negative neurological ROS  negative psych ROS   GI/Hepatic Neg liver ROS,GERD  ,,(+) Hepatitis -  Endo/Other  Hypothyroidism    Renal/GU negative Renal ROS  negative genitourinary   Musculoskeletal  (+) Arthritis ,    Abdominal   Peds negative pediatric ROS (+)  Hematology negative hematology ROS (+)   Anesthesia Other Findings Hemorrhoids  Hepatitis A Hypothyroidism  Hypertension Heart murmur  Gallstone S/P repair of ventral hernia  Cancer  Tinnitus  Previous cataract surgery 03-18-23 with Dr. Juel Burrow    Reproductive/Obstetrics negative OB ROS                             Anesthesia Physical Anesthesia Plan  ASA: 3  Anesthesia Plan: MAC   Post-op Pain Management:    Induction: Intravenous  PONV Risk Score and Plan:   Airway Management Planned: Natural Airway and Nasal Cannula  Additional Equipment:   Intra-op Plan:   Post-operative Plan:   Informed Consent: I have reviewed the patients History and Physical, chart, labs and discussed the procedure including the risks, benefits and alternatives for the proposed anesthesia with the patient or authorized representative who has indicated his/her understanding and acceptance.     Dental Advisory Given  Plan Discussed with: Anesthesiologist, CRNA and Surgeon  Anesthesia Plan Comments: (Patient consented for  risks of anesthesia including but not limited to:  - adverse reactions to medications - damage to eyes, teeth, lips or other oral mucosa - nerve damage due to positioning  - sore throat or hoarseness - Damage to heart, brain, nerves, lungs, other parts of body or loss of life  Patient voiced understanding.)        Anesthesia Quick Evaluation

## 2023-03-27 NOTE — Discharge Instructions (Signed)

## 2023-04-01 ENCOUNTER — Ambulatory Visit: Payer: Medicare PPO | Admitting: Anesthesiology

## 2023-04-01 ENCOUNTER — Encounter
Admission: RE | Disposition: A | Discharge status: Home / Self Care | Payer: Self-pay | Source: Home / Self Care | Attending: Ophthalmology

## 2023-04-01 ENCOUNTER — Encounter: Payer: Self-pay | Admitting: Ophthalmology

## 2023-04-01 ENCOUNTER — Ambulatory Visit
Admission: RE | Admit: 2023-04-01 | Discharge: 2023-04-01 | Disposition: A | Discharge status: Home / Self Care | Payer: Medicare PPO | Attending: Ophthalmology | Admitting: Ophthalmology

## 2023-04-01 ENCOUNTER — Other Ambulatory Visit: Payer: Self-pay

## 2023-04-01 DIAGNOSIS — E039 Hypothyroidism, unspecified: Secondary | ICD-10-CM | POA: Insufficient documentation

## 2023-04-01 DIAGNOSIS — I10 Essential (primary) hypertension: Secondary | ICD-10-CM | POA: Insufficient documentation

## 2023-04-01 DIAGNOSIS — Z87891 Personal history of nicotine dependence: Secondary | ICD-10-CM | POA: Insufficient documentation

## 2023-04-01 DIAGNOSIS — K219 Gastro-esophageal reflux disease without esophagitis: Secondary | ICD-10-CM | POA: Diagnosis not present

## 2023-04-01 DIAGNOSIS — H2511 Age-related nuclear cataract, right eye: Secondary | ICD-10-CM | POA: Insufficient documentation

## 2023-04-01 HISTORY — PX: CATARACT EXTRACTION W/PHACO: SHX586

## 2023-04-01 SURGERY — PHACOEMULSIFICATION, CATARACT, WITH IOL INSERTION
Anesthesia: Monitor Anesthesia Care | Laterality: Right

## 2023-04-01 MED ORDER — MIDAZOLAM HCL 2 MG/2ML IJ SOLN
INTRAMUSCULAR | Status: DC | PRN
  Administered 2023-04-01: 1 mg via INTRAVENOUS

## 2023-04-01 MED ORDER — ARMC OPHTHALMIC DILATING DROPS
1.0000 | OPHTHALMIC | Status: DC | PRN
  Administered 2023-04-01 (×3): 1 via OPHTHALMIC

## 2023-04-01 MED ORDER — ARMC OPHTHALMIC DILATING DROPS
OPHTHALMIC | Status: AC
  Filled 2023-04-01: qty 0.5

## 2023-04-01 MED ORDER — SIGHTPATH DOSE#1 BSS IO SOLN
INTRAOCULAR | Status: DC | PRN
  Administered 2023-04-01: 15 mL via INTRAOCULAR

## 2023-04-01 MED ORDER — MIDAZOLAM HCL 2 MG/2ML IJ SOLN
INTRAMUSCULAR | Status: AC
  Filled 2023-04-01: qty 2

## 2023-04-01 MED ORDER — FENTANYL CITRATE (PF) 100 MCG/2ML IJ SOLN
INTRAMUSCULAR | Status: DC | PRN
  Administered 2023-04-01: 25 ug via INTRAVENOUS

## 2023-04-01 MED ORDER — TETRACAINE HCL 0.5 % OP SOLN
1.0000 [drp] | OPHTHALMIC | Status: DC | PRN
  Administered 2023-04-01 (×3): 1 [drp] via OPHTHALMIC

## 2023-04-01 MED ORDER — SIGHTPATH DOSE#1 NA HYALUR & NA CHOND-NA HYALUR IO KIT
PACK | INTRAOCULAR | Status: DC | PRN
  Administered 2023-04-01: 1 via OPHTHALMIC

## 2023-04-01 MED ORDER — FENTANYL CITRATE (PF) 100 MCG/2ML IJ SOLN
INTRAMUSCULAR | Status: AC
  Filled 2023-04-01: qty 2

## 2023-04-01 MED ORDER — LACTATED RINGERS IV SOLN: INTRAVENOUS | Status: DC

## 2023-04-01 MED ORDER — TETRACAINE HCL 0.5 % OP SOLN
OPHTHALMIC | Status: AC
  Filled 2023-04-01: qty 4

## 2023-04-01 MED ORDER — MOXIFLOXACIN HCL 0.5 % OP SOLN
OPHTHALMIC | Status: DC | PRN
  Administered 2023-04-01: .2 mL via OPHTHALMIC

## 2023-04-01 MED ORDER — SIGHTPATH DOSE#1 BSS IO SOLN
INTRAOCULAR | Status: DC | PRN
  Administered 2023-04-01: 92 mL via OPHTHALMIC

## 2023-04-01 MED ORDER — LIDOCAINE HCL (PF) 2 % IJ SOLN
INTRAOCULAR | Status: DC | PRN
  Administered 2023-04-01: 1 mL via INTRAOCULAR

## 2023-04-01 SURGICAL SUPPLY — 17 items
CANNULA ANT/CHMB 27G (MISCELLANEOUS) IMPLANT
CANNULA ANT/CHMB 27GA (MISCELLANEOUS)
CATARACT SUITE SIGHTPATH (MISCELLANEOUS) ×1
DISSECTOR HYDRO NUCLEUS 50X22 (MISCELLANEOUS) ×1 IMPLANT
FEE CATARACT SUITE SIGHTPATH (MISCELLANEOUS) ×1 IMPLANT
GLOVE SURG GAMMEX PI TX LF 7.5 (GLOVE) ×1 IMPLANT
GLOVE SURG SYN 8.5 E (GLOVE) ×1
GLOVE SURG SYN 8.5 PF PI (GLOVE) ×1 IMPLANT
LENS IOL TECNIS EYHANCE 14.5 (Intraocular Lens) IMPLANT
NDL FILTER BLUNT 18X1 1/2 (NEEDLE) ×1 IMPLANT
NEEDLE FILTER BLUNT 18X1 1/2 (NEEDLE) ×1
PACK VIT ANT 23G (MISCELLANEOUS) IMPLANT
RING MALYGIN (MISCELLANEOUS) IMPLANT
SUT ETHILON 10-0 CS-B-6CS-B-6 (SUTURE)
SUTURE EHLN 10-0 CS-B-6CS-B-6 (SUTURE) IMPLANT
SYR 3ML LL SCALE MARK (SYRINGE) ×1 IMPLANT
SYR 5ML LL (SYRINGE) ×1 IMPLANT

## 2023-04-01 NOTE — H&P (Signed)
[facility]   Primary Care Physician:  Marguarite Arbour, MD Ophthalmologist: Dr. Willey Blade  Pre-Procedure History & Physical: HPI:  Mallory Sanders is a 82 y.o. female here for cataract surgery.   Past Medical History:  Diagnosis Date   Cancer (HCC)    skin cancer on left side of face   Gallstone    Heart murmur 1999   Hemorrhoids    Hepatitis A 1962   Hepatitis A 1962   Hypertension    Hypothyroidism    s/p thyroidectomy   S/P repair of ventral hernia 09/25/2003   Lower abdominal incarcerated ventral hernia repaired with Kugel patch   Tinnitus     Past Surgical History:  Procedure Laterality Date   ABDOMINAL HYSTERECTOMY  07/02/1985   APPENDECTOMY  07/02/1948   CATARACT EXTRACTION W/PHACO Left 03/18/2023   Procedure: CATARACT EXTRACTION PHACO AND INTRAOCULAR LENS PLACEMENT (IOC) LEFT 5.44 00:44.2;  Surgeon: Nevada Crane, MD;  Location: [facility];  Service: Ophthalmology;  Laterality: Left;   COLONOSCOPY  07/03/2003   Dr. Lemar Livings   COLONOSCOPY WITH PROPOFOL N/A 02/01/2015   Procedure: COLONOSCOPY WITH PROPOFOL;  Surgeon: [name], MD;  Location: [facility];  Service: Endoscopy;  Laterality: N/A;   HERNIA REPAIR  07/03/2003   Lower abdominal incarcerated ventral hernia repaired with Kugel patch   HERNIA REPAIR  03/16/2013   umbilical hernia repair   REPLACEMENT TOTAL KNEE  04/26/2010   right   SKIN CANCER EXCISION  12/31/2014   THYROIDECTOMY  08/30/2005   Dr Elenore Rota - [facility]   TOTAL KNEE ARTHROPLASTY  04/13/2009   [facility], left knee    Prior to Admission medications   Medication Sig Start Date End Date Taking? Authorizing Provider  aspirin 81 MG tablet Take 81 mg by mouth daily.    Yes [provider]  Calcium Carbonate-Vitamin D (CALCIUM-VITAMIN D) 500-200 MG-UNIT per tablet Take 1 tablet by mouth 3 (three) times daily.     Yes [provider]  dimenhyDRINATE (DRAMAMINE) 50 MG tablet Take 50 mg by mouth as needed.    Yes [provider]  fexofenadine (ALLEGRA) 180 MG tablet Take 180 mg by mouth daily.    Yes [provider]  Ibuprofen 200 MG CAPS Take 2 capsules by mouth as needed.   Yes [provider]  lansoprazole (PREVACID) 30 MG capsule TAKE 1 CAPSULE EVERY DAY 03/16/22  Yes Bosie Clos, MD  levothyroxine (SYNTHROID) 75 MCG tablet Take 75 mcg by mouth daily before breakfast. Friday, Saturday   Yes [provider]  levothyroxine (SYNTHROID, LEVOTHROID) 100 MCG tablet Take 1 tablet (100 mcg total) by mouth daily before breakfast. Patient taking differently: Take 100 mcg by mouth daily before breakfast. Sunday - Thursday 10/23/17  Yes Bosie Clos, MD  lisinopril (ZESTRIL) 20 MG tablet TAKE 1 TABLET BY MOUTH DAILY 02/19/22  Yes Bosie Clos, MD  meclizine (ANTIVERT) 25 MG tablet Take 25 mg by mouth 3 (three) times daily as needed for dizziness.   Yes [provider]  mometasone (NASONEX) 50 MCG/ACT nasal spray Place 2 sprays into the nose daily.   Yes [provider]  Multiple Vitamins-Minerals (PRESERVISION AREDS 2 PO) Take 1 tablet by mouth 2 (two) times daily.   Yes [provider]  Nutritional Supplements (JUICE PLUS FIBRE PO) Take 2 capsules by mouth 2 (two) times daily.    Yes [provider]    Allergies as of 02/04/2023 - Review Complete 01/18/2022  Allergen Reaction Noted   Propoxyphene n-acetaminophen  04/17/2010   Streptomycin  04/17/2010   Tramadol hcl     Ultra antioxidant formula [anti-oxidant] Nausea Only 07/29/2012    Family History  Problem Relation Age of Onset   Cancer Mother    Breast cancer Mother 31   Leukemia Mother    Stroke Father    Breast cancer Maternal Aunt    Colon cancer Maternal Uncle    Breast cancer Cousin    Breast cancer Cousin    Coronary artery disease Neg Hx     Social History   Socioeconomic History   Marital status: Widowed    Spouse name: Not on file    Number of children: 1   Years of education: Master's   Highest education level: Master's degree (e.g., MA, MS, MEng, MEd, MSW, MBA)  Occupational History   Occupation: Retired, Runner, broadcasting/film/video  Tobacco Use   Smoking status: Former    Current packs/day: 0.00    Average packs/day: 0.1 packs/day for 12.0 years (1.2 ttl pk-yrs)    Types: Cigarettes    Start date: 74    Quit date: 07/02/1985    Years since quitting: 37.7   Smokeless tobacco: Never  Vaping Use   Vaping status: Never Used  Substance and Sexual Activity   Alcohol use: Yes    Comment: seldom 1 glass of wine   Drug use: No   Sexual activity: Not on file  Other Topics Concern   Not on file  Social History Narrative   Widowed   Does not get regular exercise   Social Determinants of Health   Financial Resource Strain: Low Risk  (05/30/2021)   Overall Financial Resource Strain (CARDIA)    Difficulty of Paying Living Expenses: Not hard at all  Food Insecurity: No Food Insecurity (04/05/2022)   Hunger Vital Sign    Worried About Running Out of Food in the Last Year: Never true    Ran Out of Food in the Last Year: Never true  Transportation Needs: No Transportation Needs (04/05/2022)   PRAPARE - Administrator, Civil Service (Medical): No    Lack of Transportation (Non-Medical): No  Physical Activity: Insufficiently Active (05/30/2021)   Exercise Vital Sign    Days of Exercise per Week: 1 day    Minutes of Exercise per Session: 20 min  Stress: No Stress Concern Present (04/05/2022)   [employer] of Occupational Health - Occupational Stress Questionnaire    Feeling of Stress : Not at all  Social Connections: Moderately Isolated (04/05/2022)   Social Connection and Isolation Panel [NHANES]    Frequency of Communication with Friends and Family: More than three times a week    Frequency of Social Gatherings with Friends and Family: More than three times a week    Attends Religious Services: Never    Loss adjuster, chartered or Organizations: Yes    Attends Engineer, structural: More than 4 times per year    Marital Status: Widowed  Intimate Partner Violence: Not At Risk (05/30/2021)   Humiliation, Afraid, Rape, and Kick questionnaire    Fear of Current or Ex-Partner: No    Emotionally Abused: No    Physically Abused: No    Sexually Abused: No    Review of Systems: See HPI, otherwise negative ROS  Physical Exam: BP (!) 145/82   Pulse 72   Temp 97.7 F (36.5 C) (Temporal)   Resp 11   Ht 5' 4.02" (1.626 m)  Wt 89.4 kg   SpO2 100%   BMI 33.79 kg/m  General:   Alert, cooperative in NAD Head:  Normocephalic and atraumatic. Respiratory:  Normal work of breathing. Cardiovascular:  RRR  Impression/Plan: Mallory Sanders is here for cataract surgery.  Risks, benefits, limitations, and alternatives regarding cataract surgery have been reviewed with the patient.  Questions have been answered.  All parties agreeable.   Willey Blade, MD  04/01/2023, 10:20 AM

## 2023-04-01 NOTE — Anesthesia Postprocedure Evaluation (Signed)
Anesthesia Post Note  [name]  Procedure(s) Performed: CATARACT EXTRACTION PHACO AND INTRAOCULAR LENS PLACEMENT (IOC) RIGHT 6.62 00:41.1 (Right)  Patient location during evaluation: PACU Anesthesia Type: MAC Level of consciousness: awake and alert Pain management: pain level controlled Vital Signs Assessment: post-procedure vital signs reviewed and stable Respiratory status: spontaneous breathing, nonlabored ventilation, respiratory function stable and patient connected to nasal cannula oxygen Cardiovascular status: stable and blood pressure returned to baseline Postop Assessment: no apparent nausea or vomiting Anesthetic complications: no   No notable events documented.   Last Vitals:  Vitals:   04/01/23 1049 04/01/23 1057  BP: (!) 152/58 (!) 154/78  Pulse: 63   Resp: 20 15  Temp: 36.6 C   SpO2: 100% 100%    Last Pain:  Vitals:   04/01/23 1057  TempSrc:   PainSc: 0-No pain                 Gerturde Kuba C Malaquias Lenker

## 2023-04-01 NOTE — Op Note (Signed)
OPERATIVE NOTE  Mallory Sanders [id number] 04/01/2023   PREOPERATIVE DIAGNOSIS:  Nuclear sclerotic cataract right eye.  H25.11   POSTOPERATIVE DIAGNOSIS:    Nuclear sclerotic cataract right eye.     PROCEDURE:  Phacoemusification with posterior chamber intraocular lens placement of the right eye   LENS:   Implant Name Type Inv. Item Serial No. Manufacturer Lot No. LRB No. Used Action  LENS IOL TECNIS EYHANCE 14.5 - [id number] Intraocular Lens LENS IOL TECNIS EYHANCE 14.5 [id number] [employer]  Right 1 Implanted       Procedure(s): CATARACT EXTRACTION PHACO AND INTRAOCULAR LENS PLACEMENT (IOC) RIGHT 6.62 00:41.1 (Right)  SURGEON:  Willey Blade, MD, MPH  ANESTHESIOLOGIST: Anesthesiologist: Marisue Humble, MD CRNA: Barbette Hair, CRNA   ANESTHESIA:  Topical with tetracaine drops augmented with 1% preservative-free intracameral lidocaine.  ESTIMATED BLOOD LOSS: less than 1 mL.   COMPLICATIONS:  None.   DESCRIPTION OF PROCEDURE:  The patient was identified in the holding room and transported to the operating room and placed in the supine position under the operating microscope.  The right eye was identified as the operative eye and it was prepped and draped in the usual sterile ophthalmic fashion.   A 1.0 millimeter clear-corneal paracentesis was made at the 10:30 position. 0.5 ml of preservative-free 1% lidocaine with epinephrine was injected into the anterior chamber.  The anterior chamber was filled with viscoelastic.  A 2.4 millimeter keratome was used to make a near-clear corneal incision at the 8:00 position.  A curvilinear capsulorrhexis was made with a cystotome and capsulorrhexis forceps.  Balanced salt solution was used to hydrodissect and hydrodelineate the nucleus.   Phacoemulsification was then used in stop and chop fashion to remove the lens nucleus and epinucleus.  The remaining cortex was then removed using the irrigation and aspiration handpiece. Viscoelastic was then  placed into the capsular bag to distend it for lens placement.  A lens was then injected into the capsular bag.  The remaining viscoelastic was aspirated.   Wounds were hydrated with balanced salt solution.  The anterior chamber was inflated to a physiologic pressure with balanced salt solution.   Intracameral vigamox 0.1 mL undiluted was injected into the eye and a drop placed onto the ocular surface.  No wound leaks were noted.  The patient was taken to the recovery room in stable condition without complications of anesthesia or surgery  Willey Blade 04/01/2023, 10:46 AM

## 2023-04-01 NOTE — Transfer of Care (Signed)
Immediate Anesthesia Transfer of Care Note  [name]  Procedure(s) Performed: CATARACT EXTRACTION PHACO AND INTRAOCULAR LENS PLACEMENT (IOC) RIGHT (Right)  Patient Location: PACU  Anesthesia Type: MAC  Level of Consciousness: awake, alert  and patient cooperative  Airway and Oxygen Therapy: Patient Spontanous Breathing and Patient connected to supplemental oxygen  Post-op Assessment: Post-op Vital signs reviewed, Patient's Cardiovascular Status Stable, Respiratory Function Stable, Patent Airway and No signs of Nausea or vomiting  Post-op Vital Signs: Reviewed and stable  Complications: No notable events documented.

## 2023-05-06 ENCOUNTER — Other Ambulatory Visit: Payer: Self-pay | Admitting: Internal Medicine

## 2023-05-06 DIAGNOSIS — Z1231 Encounter for screening mammogram for malignant neoplasm of breast: Secondary | ICD-10-CM

## 2023-05-22 ENCOUNTER — Ambulatory Visit
Admission: RE | Admit: 2023-05-22 | Discharge: 2023-05-22 | Disposition: A | Discharge status: Home / Self Care | Payer: Medicare PPO | Source: Ambulatory Visit | Attending: Internal Medicine | Admitting: Internal Medicine

## 2023-05-22 DIAGNOSIS — Z1231 Encounter for screening mammogram for malignant neoplasm of breast: Secondary | ICD-10-CM | POA: Insufficient documentation

## 2024-02-28 ENCOUNTER — Other Ambulatory Visit: Payer: Self-pay | Admitting: Internal Medicine

## 2024-02-28 DIAGNOSIS — Z1231 Encounter for screening mammogram for malignant neoplasm of breast: Secondary | ICD-10-CM

## 2024-05-26 ENCOUNTER — Ambulatory Visit: Admitting: Podiatry

## 2024-07-30 ENCOUNTER — Encounter

## 2024-08-11 ENCOUNTER — Encounter
